# Patient Record
Sex: Male | Born: 1975 | Race: White | Hispanic: No | Marital: Married | Smoking: Never smoker
Health system: Southern US, Community
[De-identification: ages and names within clinical notes are randomized; demographics above are authoritative.]

## PROBLEM LIST (undated history)

## (undated) DIAGNOSIS — Z9989 Dependence on other enabling machines and devices: Secondary | ICD-10-CM

## (undated) DIAGNOSIS — E1129 Type 2 diabetes mellitus with other diabetic kidney complication: Secondary | ICD-10-CM

## (undated) DIAGNOSIS — E119 Type 2 diabetes mellitus without complications: Secondary | ICD-10-CM

## (undated) DIAGNOSIS — G4733 Obstructive sleep apnea (adult) (pediatric): Secondary | ICD-10-CM

## (undated) HISTORY — PX: VASECTOMY: SHX75

## (undated) HISTORY — DX: Dependence on other enabling machines and devices: Z99.89

## (undated) HISTORY — DX: Obstructive sleep apnea (adult) (pediatric): G47.33

## (undated) HISTORY — PX: REFRACTIVE SURGERY: SHX103

## (undated) HISTORY — DX: Type 2 diabetes mellitus with other diabetic kidney complication: E11.29

## (undated) HISTORY — PX: KNEE ARTHROSCOPY: SUR90

## (undated) HISTORY — DX: Type 2 diabetes mellitus without complications: E11.9

---

## 2017-10-14 ENCOUNTER — Encounter: Payer: Self-pay | Admitting: Physician Assistant

## 2017-10-14 ENCOUNTER — Ambulatory Visit (INDEPENDENT_AMBULATORY_CARE_PROVIDER_SITE_OTHER): Payer: Managed Care, Other (non HMO) | Admitting: Physician Assistant

## 2017-10-14 VITALS — BP 137/90 | HR 74 | Ht 70.0 in | Wt 247.0 lb

## 2017-10-14 DIAGNOSIS — Z9989 Dependence on other enabling machines and devices: Secondary | ICD-10-CM

## 2017-10-14 DIAGNOSIS — Z130 Encounter for screening for diseases of the blood and blood-forming organs and certain disorders involving the immune mechanism: Secondary | ICD-10-CM

## 2017-10-14 DIAGNOSIS — R350 Frequency of micturition: Secondary | ICD-10-CM

## 2017-10-14 DIAGNOSIS — Z131 Encounter for screening for diabetes mellitus: Secondary | ICD-10-CM

## 2017-10-14 DIAGNOSIS — G4733 Obstructive sleep apnea (adult) (pediatric): Secondary | ICD-10-CM | POA: Insufficient documentation

## 2017-10-14 DIAGNOSIS — Z7689 Persons encountering health services in other specified circumstances: Secondary | ICD-10-CM | POA: Diagnosis not present

## 2017-10-14 DIAGNOSIS — R740 Nonspecific elevation of levels of transaminase and lactic acid dehydrogenase [LDH]: Secondary | ICD-10-CM | POA: Diagnosis not present

## 2017-10-14 DIAGNOSIS — R3129 Other microscopic hematuria: Secondary | ICD-10-CM | POA: Diagnosis not present

## 2017-10-14 DIAGNOSIS — R81 Glycosuria: Secondary | ICD-10-CM

## 2017-10-14 DIAGNOSIS — E782 Mixed hyperlipidemia: Secondary | ICD-10-CM | POA: Diagnosis not present

## 2017-10-14 DIAGNOSIS — Z13220 Encounter for screening for lipoid disorders: Secondary | ICD-10-CM | POA: Diagnosis not present

## 2017-10-14 DIAGNOSIS — E1165 Type 2 diabetes mellitus with hyperglycemia: Secondary | ICD-10-CM

## 2017-10-14 DIAGNOSIS — R748 Abnormal levels of other serum enzymes: Secondary | ICD-10-CM

## 2017-10-14 DIAGNOSIS — R7401 Elevation of levels of liver transaminase levels: Secondary | ICD-10-CM

## 2017-10-14 LAB — GLUCOSE, POCT (MANUAL RESULT ENTRY): POC GLUCOSE: 383 mg/dL — AB (ref 70–99)

## 2017-10-14 LAB — POCT URINALYSIS DIPSTICK
Bilirubin, UA: NEGATIVE
GLUCOSE UA: POSITIVE — AB
Ketones, UA: NEGATIVE
Leukocytes, UA: NEGATIVE
Nitrite, UA: NEGATIVE
PROTEIN UA: NEGATIVE
Spec Grav, UA: 1.02 (ref 1.010–1.025)
Urobilinogen, UA: 0.2 E.U./dL
pH, UA: 6 (ref 5.0–8.0)

## 2017-10-14 MED ORDER — METFORMIN HCL 500 MG PO TABS: ORAL_TABLET | ORAL | 0 refills | Status: DC

## 2017-10-14 MED ORDER — METFORMIN HCL 500 MG PO TABS: ORAL_TABLET | ORAL | 2 refills | Status: DC

## 2017-10-14 NOTE — Patient Instructions (Signed)

## 2017-10-14 NOTE — Progress Notes (Signed)
HPI:                                                                Douglas Wilkinson is a 42 y.o. male who presents to [facility]: Primary Care [facility] Medicine today to establish care  Current concerns: urinary frequency  Reports 5 weeks of urinary frequency and excessive thirst. Reports he is urinating almost every 2 hours and having nocturia 4 times per night. Reports his mouth and lips are always dry. Symptoms are to the point where he is unable to wear his CPAP at night. Denies urgency, dysuria, hesitancy. Drinks 1-2 cans of soda or tea per day  IPSS Questionnaire (AUA-7): Over the past month.   1)  How often have you had a sensation of not emptying your bladder completely after you finish urinating?  0 - Not at all  2)  How often have you had to urinate again less than two hours after you finished urinating? 5 - Almost always  3)  How often have you found you stopped and started again several times when you urinated?  1 - Less than 1 time in 5  4) How difficult have you found it to postpone urination?  3 - About half the time  5) How often have you had a weak urinary stream?  1 - Less than 1 time in 5  6) How often have you had to push or strain to begin urination?  0 - Not at all  7) How many times did you most typically get up to urinate from the time you went to bed until the time you got up in the morning?  4 - 4 times  Total score:  0-7 mildly symptomatic   8-19 moderately symptomatic   20-35 severely symptomatic     No flowsheet data found.  No flowsheet data found.    Past Medical History:  Diagnosis Date  . OSA on CPAP    Past Surgical History:  Procedure Laterality Date  . KNEE ARTHROSCOPY Right   . VASECTOMY     Social History   Tobacco Use  . Smoking status: Never Smoker  . Smokeless tobacco: Never Used  Substance Use Topics  . Alcohol use: Not Currently   family history includes Heart attack in his father; Skin cancer in his maternal  grandfather.    ROS: negative except as noted in the HPI  Medications: Current Outpatient Medications  Medication Sig Dispense Refill  . atorvastatin (LIPITOR) 20 MG tablet Take 1 tablet (20 mg total) by mouth daily. 30 tablet 3  . metFORMIN (GLUCOPHAGE) 500 MG tablet Take 1 tablet (500 mg total) by mouth 2 (two) times daily with a meal for 7 days, THEN 2 tablets (1,000 mg total) 2 (two) times daily with a meal for 23 days. 120 tablet 0   No current facility-administered medications for this visit.    No Known Allergies     Objective:  BP 137/90   Pulse 74   Ht 5\' 10"  (1.778 m)   Wt 247 lb (112 kg)   BMI 35.44 kg/m  Gen:  alert, not ill-appearing, no distress, appropriate for age, obese male HEENT: head normocephalic without obvious abnormality, conjunctiva and cornea clear, trachea midline Pulm: Normal work  of breathing, normal phonation, clear to auscultation bilaterally, no wheezes, rales or rhonchi CV: Normal rate, regular rhythm, s1 and s2 distinct, no murmurs, clicks or rubs  Neuro: alert and oriented x 3, no tremor MSK: extremities atraumatic, normal gait and station Skin: intact, no rashes on exposed skin, no jaundice, no cyanosis Psych: well-groomed, cooperative, good eye contact, euthymic mood, affect mood-congruent, speech is articulate, and thought processes clear and goal-directed    No results found for this or any previous visit (from the past 72 hour(s)). No results found.    Assessment and Plan: 42 y.o. male with   Encounter to establish care  Urinary frequency - Plan: Comprehensive metabolic panel, Hemoglobin A1c, CANCELED: Osmolality, CANCELED: Osmolality, urine, CANCELED: Sodium, urine, random  Screening for lipid disorders - Plan: Lipid Panel w/reflex Direct LDL  Screening for diabetes mellitus - Plan: Hemoglobin A1c  Screening for blood disease - Plan: CBC, Comprehensive metabolic panel  Glucosuria - Plan: Hemoglobin A1c  Microscopic  hematuria - Plan: POCT Urinalysis Dipstick, Urine Culture, POCT Glucose (CBG)  Type 2 diabetes mellitus with hyperglycemia, without long-term current use of insulin (HCC) - Plan: metFORMIN (GLUCOPHAGE) 500 MG tablet, POCT Glucose (CBG), Ambulatory referral to diabetic education, DISCONTINUED: metFORMIN (GLUCOPHAGE) 500 MG tablet  Mixed hypercholesterolemia and hypertriglyceridemia - Plan: atorvastatin (LIPITOR) 20 MG tablet  Transaminitis - Plan: Hepatitis panel, acute, US ELASTOGRAPHY LIVER  Elevated alkaline phosphatase level - Plan: US ELASTOGRAPHY LIVER  - Personally reviewed PMH, PSH, PFH, medications, allergies, HM - Age-appropriate cancer screening: n/a - Influenza n/a - Tdap declined  Type 2 Diabetes Lab Results  Component Value Date   HGBA1C 11.0 (H) 10/14/2017  - new diagnosis, presented with polyuria and glucosuria. A1C 11 - discussed pathophysiology and management of type 2 diabetes - counseled on ADA diet. Referred to diabetes educator - Metformin 1000 mg bid. Will likely need dual therapy for adequate glycemic control - plan to start stain based on fasting lipid panel - plan to start ACE if BP not at goal <130/80  Patient education and anticipatory guidance given Patient agrees with treatment plan Follow-up in 1 month or sooner as needed if symptoms worsen or fail to improve  Levonne Hubertharley E. Tephanie Escorcia PA-C

## 2017-10-15 LAB — CBC
HCT: 45.7 % (ref 38.5–50.0)
HEMOGLOBIN: 16.4 g/dL (ref 13.2–17.1)
MCH: 31.3 pg (ref 27.0–33.0)
MCHC: 35.9 g/dL (ref 32.0–36.0)
MCV: 87.2 fL (ref 80.0–100.0)
MPV: 10.7 fL (ref 7.5–12.5)
Platelets: 210 10*3/uL (ref 140–400)
RBC: 5.24 10*6/uL (ref 4.20–5.80)
RDW: 12.3 % (ref 11.0–15.0)
WBC: 6.1 10*3/uL (ref 3.8–10.8)

## 2017-10-15 LAB — COMPREHENSIVE METABOLIC PANEL
AG Ratio: 1.7 (calc) (ref 1.0–2.5)
ALBUMIN MSPROF: 4.7 g/dL (ref 3.6–5.1)
ALT: 114 U/L — AB (ref 9–46)
AST: 56 U/L — ABNORMAL HIGH (ref 10–40)
Alkaline phosphatase (APISO): 117 U/L — ABNORMAL HIGH (ref 40–115)
BILIRUBIN TOTAL: 0.8 mg/dL (ref 0.2–1.2)
BUN: 15 mg/dL (ref 7–25)
CALCIUM: 9.6 mg/dL (ref 8.6–10.3)
CO2: 25 mmol/L (ref 20–32)
Chloride: 99 mmol/L (ref 98–110)
Creat: 1.13 mg/dL (ref 0.60–1.35)
Globulin: 2.8 g/dL (calc) (ref 1.9–3.7)
Glucose, Bld: 383 mg/dL — ABNORMAL HIGH (ref 65–99)
Potassium: 4.2 mmol/L (ref 3.5–5.3)
Sodium: 135 mmol/L (ref 135–146)
TOTAL PROTEIN: 7.5 g/dL (ref 6.1–8.1)

## 2017-10-15 LAB — LIPID PANEL W/REFLEX DIRECT LDL
Cholesterol: 270 mg/dL — ABNORMAL HIGH (ref <200)
HDL: 24 mg/dL — AB (ref >40)
Non-HDL Cholesterol (Calc): 246 mg/dL (calc) — ABNORMAL HIGH (ref <130)
Total CHOL/HDL Ratio: 11.3 (calc) — ABNORMAL HIGH (ref <5.0)
Triglycerides: 1650 mg/dL — ABNORMAL HIGH (ref <150)

## 2017-10-15 LAB — HEMOGLOBIN A1C
Hgb A1c MFr Bld: 11 % of total Hgb — ABNORMAL HIGH (ref <5.7)
Mean Plasma Glucose: 269 (calc)
eAG (mmol/L): 14.9 (calc)

## 2017-10-15 LAB — DIRECT LDL

## 2017-10-18 MED ORDER — ATORVASTATIN CALCIUM 20 MG PO TABS: 20.0000 mg | ORAL_TABLET | Freq: Every day | ORAL | 3 refills | Status: DC

## 2017-10-18 NOTE — Progress Notes (Signed)
Good morning Douglas Wilkinson,  Your total cholesterol and triglycerdies are very high. I am going to start you on an additional medication to get these down. When you return for follow-up, please come in fasting so we can recheck your lipids (nothing to eat or drink except water for 8 hours).  The medication is called Atorvastatin. You can take it in the evening or in the morning with your other medications.  Your liver enzymes are also high, so I am going to order some additional liver tests including an ultrasound.

## 2017-10-21 ENCOUNTER — Encounter: Payer: Self-pay | Admitting: Physician Assistant

## 2017-10-23 ENCOUNTER — Encounter: Payer: Self-pay | Admitting: Physician Assistant

## 2017-10-23 DIAGNOSIS — R81 Glycosuria: Secondary | ICD-10-CM | POA: Insufficient documentation

## 2017-10-23 DIAGNOSIS — R748 Abnormal levels of other serum enzymes: Secondary | ICD-10-CM | POA: Insufficient documentation

## 2017-10-23 DIAGNOSIS — R740 Nonspecific elevation of levels of transaminase and lactic acid dehydrogenase [LDH]: Secondary | ICD-10-CM

## 2017-10-23 DIAGNOSIS — R7401 Elevation of levels of liver transaminase levels: Secondary | ICD-10-CM | POA: Insufficient documentation

## 2017-10-23 DIAGNOSIS — E782 Mixed hyperlipidemia: Secondary | ICD-10-CM | POA: Insufficient documentation

## 2017-10-23 DIAGNOSIS — E1165 Type 2 diabetes mellitus with hyperglycemia: Secondary | ICD-10-CM | POA: Insufficient documentation

## 2017-10-23 DIAGNOSIS — R3129 Other microscopic hematuria: Secondary | ICD-10-CM | POA: Insufficient documentation

## 2017-10-26 ENCOUNTER — Ambulatory Visit (HOSPITAL_COMMUNITY): Payer: Managed Care, Other (non HMO)

## 2017-10-28 ENCOUNTER — Encounter: Payer: Self-pay | Admitting: Physician Assistant

## 2017-10-28 ENCOUNTER — Ambulatory Visit (HOSPITAL_COMMUNITY)
Admission: RE | Admit: 2017-10-28 | Discharge: 2017-10-28 | Disposition: A | Discharge status: Home / Self Care | Payer: Managed Care, Other (non HMO) | Source: Ambulatory Visit | Attending: Physician Assistant | Admitting: Physician Assistant

## 2017-10-28 DIAGNOSIS — R748 Abnormal levels of other serum enzymes: Secondary | ICD-10-CM

## 2017-10-28 DIAGNOSIS — R740 Nonspecific elevation of levels of transaminase and lactic acid dehydrogenase [LDH]: Secondary | ICD-10-CM | POA: Insufficient documentation

## 2017-10-28 DIAGNOSIS — R7401 Elevation of levels of liver transaminase levels: Secondary | ICD-10-CM

## 2017-10-28 DIAGNOSIS — K760 Fatty (change of) liver, not elsewhere classified: Secondary | ICD-10-CM | POA: Insufficient documentation

## 2017-11-15 ENCOUNTER — Ambulatory Visit (INDEPENDENT_AMBULATORY_CARE_PROVIDER_SITE_OTHER): Payer: Managed Care, Other (non HMO) | Admitting: Physician Assistant

## 2017-11-15 ENCOUNTER — Encounter: Payer: Self-pay | Admitting: Physician Assistant

## 2017-11-15 VITALS — BP 108/71 | HR 79 | Temp 97.8°F | Wt 239.0 lb

## 2017-11-15 DIAGNOSIS — E1165 Type 2 diabetes mellitus with hyperglycemia: Secondary | ICD-10-CM | POA: Diagnosis not present

## 2017-11-15 DIAGNOSIS — E782 Mixed hyperlipidemia: Secondary | ICD-10-CM

## 2017-11-15 DIAGNOSIS — Z2821 Immunization not carried out because of patient refusal: Secondary | ICD-10-CM | POA: Diagnosis not present

## 2017-11-15 DIAGNOSIS — R809 Proteinuria, unspecified: Secondary | ICD-10-CM

## 2017-11-15 DIAGNOSIS — E1129 Type 2 diabetes mellitus with other diabetic kidney complication: Secondary | ICD-10-CM | POA: Insufficient documentation

## 2017-11-15 LAB — POCT UA - MICROALBUMIN

## 2017-11-15 MED ORDER — METFORMIN HCL ER 500 MG PO TB24: 500.0000 mg | ORAL_TABLET | Freq: Every day | ORAL | 5 refills | Status: DC

## 2017-11-15 MED ORDER — LISINOPRIL 5 MG PO TABS: 5.0000 mg | ORAL_TABLET | Freq: Every day | ORAL | 5 refills | Status: DC

## 2017-11-15 NOTE — Progress Notes (Signed)
HPI:                                                                Douglas Wilkinson is a 42 y.o. male who [facility] today for diabetes follow-up  DMII: new diagnosis 1 month ago. Presented with polyuria. Currently taking Metformin 1000 mg bid and is having frequent, watery bowel movements. Has been feeling very fatigued with urge to nap for the last 2 weeks. Compliant with medications.  Has an appointment with diabetes educator at the end of the month Reports has reduced carbohydrates and sugar, has lost approximately 10 pounds Denies polydipsia, polyuria, polyphagia. Denies blurred vision or vision change. Denies extremity pain, altered sensation and paresthesias.  Denies ulcers/wounds on feet. Hx of DKA/HHS: no Diabetes associated symptoms: none Blood glucose readings: FBG's 99-119 Hypoglycemia frequency: none Severe hypoglycemia (requiring 3rd party assistance): none      [facility] 11/15/2017  Decreased Interest 0  Down, Depressed, Hopeless 0  PHQ - 2 Score 0  Altered sleeping 0  Tired, decreased energy 2  Change in appetite 0  Feeling bad or failure about yourself  0  Trouble concentrating 0  Moving slowly or fidgety/restless 0  Suicidal thoughts 0  PHQ-9 Score 2  Difficult doing work/chores Not difficult at all    GAD 7 : Generalized Anxiety Score 11/15/2017  Nervous, Anxious, on Edge 0  Control/stop worrying 0  Worry too much - different things 0  Trouble relaxing 0  Restless 0  Easily annoyed or irritable 1  Afraid - awful might happen 0  Total GAD 7 Score 1  Anxiety Difficulty Not difficult at all      Past Medical History:  Diagnosis Date  . OSA on CPAP    Past Surgical History:  Procedure Laterality Date  . KNEE ARTHROSCOPY Right   . REFRACTIVE SURGERY    . VASECTOMY     Social History   Tobacco Use  . Smoking status: Never Smoker  . Smokeless tobacco: Never Used   Substance Use Topics  . Alcohol use: Not Currently   family history includes Heart attack in his father; Skin cancer in his maternal grandfather.    ROS: negative except as noted in the HPI  Medications: Current Outpatient Medications  Medication Sig Dispense Refill  . atorvastatin (LIPITOR) 20 MG tablet Take 1 tablet (20 mg total) by mouth daily. 30 tablet 3  . lisinopril (PRINIVIL,ZESTRIL) 5 MG tablet Take 1 tablet (5 mg total) by mouth daily. 30 tablet 5  . metFORMIN (GLUCOPHAGE XR) 500 MG 24 hr tablet Take 2 tablets (1,000 mg total) by mouth daily with supper. 180 tablet 0   No current facility-administered medications for this visit.    No Known Allergies     Objective:  BP 108/71   Pulse 79   Temp 97.8 F (36.6 C) (Oral)   Wt 239 lb (108.4 kg)   BMI 34.29 kg/m  Gen:  alert, not ill-appearing, no distress, appropriate for age, obese male HEENT: head normocephalic without obvious abnormality, conjunctiva and cornea clear, trachea midline Pulm: Normal work of breathing, normal phonation, clear to auscultation bilaterally, no wheezes, rales or rhonchi CV: Normal rate, regular rhythm, s1 and s2  distinct, no murmurs, clicks or rubs  Neuro: alert and oriented x 3, no tremor MSK: extremities atraumatic, normal gait and station Skin: intact, no rashes on exposed skin, no jaundice, no cyanosis Psych: well-groomed, cooperative, good eye contact, euthymic mood, affect mood-congruent, speech is articulate, and thought processes clear and goal-directed  Diabetic Foot Exam - Simple   Simple Foot Form Diabetic Foot exam was performed with the following findings:  Yes 11/15/2017  2:40 PM  Visual Inspection No deformities, no ulcerations, no other skin breakdown bilaterally:  Yes Sensation Testing Intact to touch and monofilament testing bilaterally:  Yes Pulse Check Posterior Tibialis and Dorsalis pulse intact bilaterally:  Yes Comments      No results found for this or  any previous visit (from the past 72 hour(s)). No results found.  Lab Results  Component Value Date   HGBA1C 11.0 (H) 10/14/2017     Assessment and Plan: 42 y.o. male with   .Douglas Wilkinson was seen today for diabetes.  Diagnoses and all orders for this visit:  Type 2 diabetes mellitus with hyperglycemia, without long-term current use of insulin (HCC) -     Discontinue: metFORMIN (GLUCOPHAGE XR) 500 MG 24 hr tablet; Take 1 tablet (500 mg total) by mouth daily with supper. -     POCT UA - Microalbumin -     metFORMIN (GLUCOPHAGE XR) 500 MG 24 hr tablet; Take 2 tablets (1,000 mg total) by mouth daily with supper.  Mixed hypercholesterolemia and hypertriglyceridemia -     Lipid Panel w/reflex Direct LDL  Microalbuminuria due to type 2 diabetes mellitus (HCC) -     lisinopril (PRINIVIL,ZESTRIL) 5 MG tablet; Take 1 tablet (5 mg total) by mouth daily.  Refused pneumococcal vaccination   - switching to Metformin XR due to GI upset. Will likely need second agent for glycemic control due to A1C of 11.0. Will wait until he is seen by diabetes educator. He has already lost 8 pounds in 4 weeks. - microalbuminuria on screening today, adding low-dose ACE - BP at goal <130/80 - LDL goal <70, cont Atorvastatin 20 mg - eye exam UTD per patient, requesting records from [facility] - foot exam performed today and normal - discussed diabetes preventive care - declined influenza and pneumococcal vaccines   Patient education and anticipatory guidance given Patient agrees with treatment plan Follow-up in 3 months for diabetes mgmt or sooner as needed if symptoms worsen or fail to improve  Levonne Hubertharley E. Javarion Douty PA-C

## 2017-11-15 NOTE — Patient Instructions (Addendum)
Diabetes Preventive Care: - annual foot exam  - annual dilated eye exam with an eye doctor - self foot exams at least weekly - pneumonia vaccine once (booster in 5 years and at age 42) - annual influenza vaccine - twice yearly dental cleanings and yearly exam - goal blood pressure <140/90, ideally <130/80 - LDL cholesterol <70 - A1C <2.1<7.0 - body mass index (BMI) <25.0 - follow-up every 3 months if your A1C is not at goal - follow-up every 6 months if diabetes is well controlled    Diabetic Nephropathy Diabetic nephropathy is kidney disease that is caused by diabetes (diabetes mellitus). Kidneys are organs that filter and clean blood and get rid of body waste products and extra fluid. Diabetes can cause gradual kidney damage over many years. Diabetic nephropathy that continues to get worse (progress) can lead to kidney failure. What are the causes? This condition is caused by kidney damage from diabetes that is not well controlled with treatment. Having high blood sugar (glucose) for a long time can damage blood vessels in the kidneys and cause them to thicken and scar. This prevents the kidneys from functioning normally. What increases the risk? This condition is more likely to develop in people with diabetes who:  Have had diabetes for many years.  Have high blood pressure.  Have high blood glucose levels over a long period of time.  Have a family history of kidney disease.  Have a history of tobacco use.  Have certain genes that are passed from parent to child (inherited).  Are of African-American, Hispanic, Native American, Asian, or Pacific Islander descent.  What are the signs or symptoms? This condition may not cause symptoms at first. If you do have symptoms, they may include:  Swelling of your hands, feet, or ankles.  Weakness.  Poor appetite.  Nausea.  Confusion.  Fatigue.  Trouble sleeping.  Dry, itchy skin.  If nephropathy leads to kidney failure,  symptoms may include:  Vomiting.  Shortness of breath.  Jerky movements you cannot control (seizure).  Coma.  How is this diagnosed? It is important to diagnose this condition before symptoms develop. You may be screened for diabetic nephropathy at a routine health care visit. Screening tests may include:  Yearly (annual) urine tests.  Urine collection over a 24-hour period to measure kidney function.  Blood tests that measure blood glucose levels and kidney function.  If your health care provider suspects diabetic nephropathy, he or she may:  Review your medical history and symptoms.  Do a physical exam.  Do an ultrasound of your kidneys.  Perform a procedure to take a sample of kidney tissue for testing (biopsy).  How is this treated? The goal of treatment is to prevent or slow down damage to your kidneys by managing your diabetes. To do this, it is important to control:  Your blood pressure. ? Generally, the goal is to keep your blood pressure below 130/80. Your target blood pressure depends on many factors. ? To help control blood pressure, you may be prescribed medicines to lower blood pressure (ACE inhibitors) or to help your body get rid of excess fluid (diuretics).  Your A1c (hemoglobin A1c) level. Generally, the goal of treatment is to maintain an A1c level of less than 7%.  Your blood glucose level.  Your blood lipids. If you have high cholesterol, you may need to take lipid-lowering drugs, such as statins.  Other treatments may include:  Medicines, including insulin injections.  Lifestyle changes, such as: ?  Losing weight. ? Quitting smoking (smoking cessation).  Changes to your diet, which may include: ? Limiting your salt (sodium), protein, and fluid intake. ? Taking vitamin D supplements.  If your disease progresses to end-stage kidney failure, treatment may include:  Dialysis. This is a procedure to filter your blood with a machine.  Kidney  transplant.  Follow these instructions at home: Lifestyle  Maintain a healthy weight. Work with your health care provider to lose weight, if needed.  Do not use any products that contain nicotine or tobacco, such as cigarettes and e-cigarettes. If you need help quitting, ask your health care provider.  Be physically active every day. Ask your health care provider what type of exercise is best for you.  Eat healthy foods, and eat healthy snacks between meals. Follow instructions from your health care provider about eating and drinking restrictions. ? Limit your sodium, protein, or fluid intake as directed.  Work with your health care provider to manage your blood pressure. General instructions  Follow your diabetes management plan as directed. ? Check your blood glucose levels as directed by your health care provider. ? Keep your blood glucose in your target range as directed by your health care provider. ? Have your A1c level checked at least two times a year, or as often as told by your health care provider.  Take over-the-counter and prescription medicines only as told by your health care provider. This includes insulin and supplements.  Keep all follow-up visits and routine visits as told by your health care provider. This is important. Make sure to get screening tests as directed. Contact a health care provider if:  You have trouble keeping your blood glucose in your goal range.  Your blood glucose level is higher than 240 mg/dL (16.113.3 mmol/L) for 2 days in a row.  You have swelling in your hands, ankles, or feet.  You feel weak, tired, or dizzy.  You have involuntary muscle tightening (spasms).  You have nausea or vomiting.  You feel tired all the time. Get help right away if:  You are very sleepy.  You have: ? A seizure. ? Severe, painful muscle spasms. ? Shortness of breath. ? Chest pain.  You faint. Summary  Keep your blood glucose in your target range as  directed by your health care provider.  Work with your health care provider to manage your blood pressure.  Keep all follow-up visits and routine visits as told by your health care provider. This is important. Make sure to get screening tests as directed. This information is not intended to replace advice given to you by your health care provider. Make sure you discuss any questions you have with your health care provider. Document Released: 04/11/2007 Document Revised: 02/18/2016 Document Reviewed: 02/18/2016 Elsevier Interactive Patient Education  [employer]2018 [employer]

## 2017-11-17 LAB — LIPID PANEL W/REFLEX DIRECT LDL
Cholesterol: 111 mg/dL (ref <200)
HDL: 27 mg/dL — ABNORMAL LOW (ref >40)
LDL Cholesterol (Calc): 63 mg/dL (calc)
Non-HDL Cholesterol (Calc): 84 mg/dL (calc) (ref <130)
Total CHOL/HDL Ratio: 4.1 (calc) (ref <5.0)
Triglycerides: 124 mg/dL (ref <150)

## 2017-11-26 ENCOUNTER — Encounter: Payer: Self-pay | Admitting: Physician Assistant

## 2017-11-26 DIAGNOSIS — Z2821 Immunization not carried out because of patient refusal: Secondary | ICD-10-CM | POA: Insufficient documentation

## 2017-11-26 MED ORDER — METFORMIN HCL ER 500 MG PO TB24
1000.0000 mg | ORAL_TABLET | Freq: Every day | ORAL | 0 refills | Status: DC
Start: 2017-11-26 — End: 2018-02-24

## 2017-12-01 ENCOUNTER — Encounter: Payer: Self-pay | Admitting: Registered"

## 2017-12-01 ENCOUNTER — Encounter: Payer: Managed Care, Other (non HMO) | Attending: Physician Assistant | Admitting: Registered"

## 2017-12-01 DIAGNOSIS — E1165 Type 2 diabetes mellitus with hyperglycemia: Secondary | ICD-10-CM | POA: Diagnosis present

## 2017-12-01 DIAGNOSIS — Z713 Dietary counseling and surveillance: Secondary | ICD-10-CM | POA: Diagnosis not present

## 2017-12-01 NOTE — Patient Instructions (Addendum)
Protein bar might be an option for breakfast. Can also try some smoothies that have protein included. Aim to eat balanced meals and snacks Continue checking your blood sugar and play detective to see how adding more carbs affects your blood sugar. Continue with your plan to find a exercise that you enjoy and ultimately aim for 3-5 time per week for 30-45 min

## 2017-12-01 NOTE — Progress Notes (Signed)
Diabetes Self-Management Education  Visit Type: First/Initial  Appt. Start Time: 1450 Appt. End Time: 1610  12/06/2017  [name], identified by name and date of birth, is a 42 y.o. male with a diagnosis of Diabetes: Type 2.   ASSESSMENT Pt states he has made some major changes to his diet avoiding sugar and carbs since getting the T2DM diagnosis. Pt states he does not go to the doctor very often, polyuria is what prompted this last MD visit. Pt states is was not aware he was supposed to increase his metformin dose, but because his reported SMBG is in the normal range and RD advised he talk to MD before changing medication dose.  Physcial activity: Pt states he feels tired, has a hard time being motivated at gyms because he finds them boring, but is considering kick boxing. RD believes he has cut back carbs too far and recommended including more to help increase energy. Pt states the reason he drinks caffeine for headaches.  Sleep: doesn't snore and isn't restless. Pt reports ~9:30-10 gets sleepy but waits to go to sleep so he doesn't wake up 5 am and he gets a headache if too much sleep. Pt states he is fine as long as he keeps going, but when he stops will get tired. Stress: 2/10; maybe higher at work.  Diabetes Self-Management Education - 12/01/17 1455      Visit Information   Visit Type  First/Initial      Initial Visit   Diabetes Type  Type 2    Are you currently following a meal plan?  Yes    What type of meal plan do you follow?  low sugar, low carbs    Are you taking your medications as prescribed?  Yes    Date Diagnosed  10/14/2017      Health Coping   How would you rate your overall health?  Good      Psychosocial Assessment   Patient Belief/Attitude about Diabetes  Motivated to manage diabetes    How often do you need to have someone help you when you read instructions, pamphlets, or other written materials from your doctor or pharmacy?  1 - Never    What is the  last grade level you completed in school?  HS grad      Complications   Last HgB A1C per patient/outside source  11 %    How often do you check your blood sugar?  1-2 times/day    Fasting Blood glucose range (mg/dL)  16-109    Postprandial Blood glucose range (mg/dL)  60-454    Number of hypoglycemic episodes per month  0    Number of hyperglycemic episodes per week  0    Have you had a dilated eye exam in the past 12 months?  Yes    Have you had a dental exam in the past 12 months?  Yes    Are you checking your feet?  No      Dietary Intake   Breakfast  none (coffee)    Snack (morning)  none OR P3    Lunch  usually adds veggies to lunch. 6 oz sirolin OR chicken salad     Snack (afternoon)  none or meat/cheese OR cashews, pistachios    Dinner  meat, veggies, 2 or 3 small potatoes    Snack (evening)  none or cheese & meat    Beverage(s)  water, coffee, diet soda (mt dew), unsweet tea  Exercise   Exercise Type  Light (walking / raking leaves)    How many days per week to you exercise?  1    How many minutes per day do you exercise?  50    Total minutes per week of exercise  50      Patient Education   Previous Diabetes Education  No    Disease state   Definition of diabetes, type 1 and 2, and the diagnosis of diabetes    Nutrition management   Role of diet in the treatment of diabetes and the relationship between the three main macronutrients and blood glucose level    Physical activity and exercise   Role of exercise on diabetes management, blood pressure control and cardiac health.    Monitoring  Identified appropriate SMBG and/or A1C goals.      Individualized Goals (developed by patient)   Nutrition  General guidelines for healthy choices and portions discussed    Physical Activity  Exercise 3-5 times per week    Monitoring   test my blood glucose as discussed      Outcomes   Expected Outcomes  Demonstrated interest in learning. Expect positive outcomes    Future DMSE   PRN    Program Status  Completed     Individualized Plan for Diabetes Self-Management Training:   Learning Objective:  Patient will have a greater understanding of diabetes self-management. Patient education plan is to attend individual and/or group sessions per assessed needs and concerns.   Patient Instructions  Protein bar might be an option for breakfast. Can also try some smoothies that have protein included. Aim to eat balanced meals and snacks Continue checking your blood sugar and play detective to see how adding more carbs affects your blood sugar. Continue with your plan to find a exercise that you enjoy and ultimately aim for 3-5 time per week for 30-45 min  Expected Outcomes:  Demonstrated interest in learning. Expect positive outcomes  Education material provided: ADA Diabetes: Your Take Control Guide, A1C conversion sheet and Carbohydrate counting sheet  If problems or questions, patient to contact team via:  Phone or MyChart  Future DSME appointment: PRN

## 2018-01-14 ENCOUNTER — Emergency Department (INDEPENDENT_AMBULATORY_CARE_PROVIDER_SITE_OTHER)
Admission: EM | Admit: 2018-01-14 | Discharge: 2018-01-14 | Disposition: A | Discharge status: Home / Self Care | Payer: Managed Care, Other (non HMO) | Source: Home / Self Care | Attending: Emergency Medicine | Admitting: Emergency Medicine

## 2018-01-14 ENCOUNTER — Other Ambulatory Visit: Payer: Self-pay

## 2018-01-14 DIAGNOSIS — J069 Acute upper respiratory infection, unspecified: Secondary | ICD-10-CM

## 2018-01-14 MED ORDER — AZITHROMYCIN 250 MG PO TABS: ORAL_TABLET | ORAL | 0 refills | Status: DC

## 2018-01-14 MED ORDER — BENZONATATE 100 MG PO CAPS: 100.0000 mg | ORAL_CAPSULE | Freq: Three times a day (TID) | ORAL | 0 refills | Status: DC | PRN

## 2018-01-14 NOTE — ED Provider Notes (Signed)
[facility]    CSN: [id number] Arrival date & time: 01/14/18  0931     History   Chief Complaint Chief Complaint  Patient presents with  . Cough    HPI Douglas Wilkinson is a 42 y.o. male.   HPI Patient states he became ill 3-4 days ago. He has had low-grade fever off and on as well as a deep cough. He usually has productive phlegm but does not know tlor. He has been taking TheraFlu without improvement. His concern is regarding his daughter having an emergency C-section with the newborn having congenital problems. He is going to need to get on an airplane and fly to [place].he has not had his flu shot. He does not Vape. He has not been to the fair in [place] Past Medical History:  Diagnosis Date  . OSA on CPAP     Patient Active Problem List   Diagnosis Date Noted  . Refused pneumococcal vaccination 11/26/2017  . Microalbuminuria due to type 2 diabetes mellitus (HCC) 11/15/2017  . Hepatic steatosis 10/28/2017  . Glucosuria 10/23/2017  . Microscopic hematuria 10/23/2017  . Type 2 diabetes mellitus with hyperglycemia, without long-term current use of insulin (HCC) 10/23/2017  . Mixed hypercholesterolemia and hypertriglyceridemia 10/23/2017  . Transaminitis 10/23/2017  . Elevated alkaline phosphatase level 10/23/2017  . OSA on CPAP     Past Surgical History:  Procedure Laterality Date  . KNEE ARTHROSCOPY Right   . REFRACTIVE SURGERY    . VASECTOMY         Home Medications    Prior to Admission medications   Medication Sig Start Date End Date Taking? Authorizing Provider  atorvastatin (LIPITOR) 20 MG tablet Take 1 tablet (20 mg total) by mouth daily. 10/18/17   Carlis Stable, PA-C  azithromycin (ZITHROMAX) 250 MG tablet Take 2 today then one a day for 4 days. 01/14/18   Collene Gobble, MD  benzonatate (TESSALON) 100 MG capsule Take 1-2 capsules (100-200 mg total) by mouth 3 (three) times daily as needed for cough. 01/14/18   Collene Gobble, MD  lisinopril (PRINIVIL,ZESTRIL) 5 MG tablet Take 1 tablet (5 mg total) by mouth daily. 11/15/17   Carlis Stable, PA-C  metFORMIN (GLUCOPHAGE XR) 500 MG 24 hr tablet Take 2 tablets (1,000 mg total) by mouth daily with supper. 11/26/17   Carlis Stable, PA-C    Family History Family History  Problem Relation Age of Onset  . Heart attack Father   . Skin cancer Maternal Grandfather     Social History Social History   Tobacco Use  . Smoking status: Never Smoker  . Smokeless tobacco: Never Used  Substance Use Topics  . Alcohol use: Not Currently  . Drug use: Not on file     Allergies   Patient has no known allergies.   Review of Systems Review of Systems  Constitutional: Positive for fever.  HENT: Positive for congestion.   Respiratory: Positive for cough. Negative for wheezing.   Cardiovascular: Negative for chest pain.  Gastrointestinal: Negative.      Physical Exam Triage Vital Signs ED Triage Vitals  Enc Vitals Group     BP 01/14/18 0946 (!) 143/88     Pulse Rate 01/14/18 0946 68     Resp --      Temp 01/14/18 0946 98.3 F (36.8 C)     Temp src --      SpO2 01/14/18 0946 99 %     Weight 01/14/18  0947 223 lb (101.2 kg)     Height 01/14/18 0947 5\' 10"  (1.778 m)     Head Circumference --      Peak Flow --      Pain Score 01/14/18 0947 0     Pain Loc --      Pain Edu? --      Excl. in GC? --    No data found.  Updated Vital Signs BP (!) 143/88 (BP Location: Right Arm)   Pulse 68   Temp 98.3 F (36.8 C)   Ht 5\' 10"  (1.778 m)   Wt 101.2 kg   SpO2 99%   BMI 32.00 kg/m   Visual Acuity Right Eye Distance:   Left Eye Distance:   Bilateral Distance:    Right Eye Near:   Left Eye Near:    Bilateral Near:     Physical Exam  Constitutional: He appears well-developed and well-nourished.  HENT:  Nose: Nose normal.  Mouth/Throat: Oropharynx is clear and moist.  Neck: Normal range of motion.  Cardiovascular: Normal  rate and regular rhythm.  Pulmonary/Chest: Effort normal and breath sounds normal.     UC Treatments / Results  Labs (all labs ordered are listed, but only abnormal results are displayed) Labs Reviewed - No data to display  EKG None  Radiology No results found.  Procedures Procedures (including critical care time)  Medications Ordered in UC Medications - No data to display  Initial Impression / Assessment and Plan / UC Course  I have reviewed the triage vital signs and the nursing notes.  Pertinent labs & imaging results that were available during my care of the patient were reviewed by me and considered in my medical decision making (see chart for details). This is most likely a viral upper respiratory infection. Treatment is complicated because of recent family issues and need to fly to [place] tomorrow. I will treat with Zithromax and Tessalonpearls for coverage.     Final Clinical Impressions(s) / UC Diagnoses   Final diagnoses:  Acute upper respiratory infection     Discharge Instructions     Take medication as instructed. Watch watch your diabetes closely. If you develop worsening symptoms or shortness of breath please present to the emergency room.    ED Prescriptions    Medication Sig Dispense Auth. Provider   azithromycin (ZITHROMAX) 250 MG tablet Take 2 today then one a day for 4 days. 6 tablet Collene Gobble, MD   benzonatate (TESSALON) 100 MG capsule Take 1-2 capsules (100-200 mg total) by mouth 3 (three) times daily as needed for cough. 40 capsule Collene Gobble, MD     Controlled Substance Prescriptions [place] Controlled Substance Registry consulted? Not Applicable   Collene Gobble, MD 01/14/18 1635

## 2018-01-14 NOTE — Discharge Instructions (Signed)
Take medication as instructed. Watch watch your diabetes closely. If you develop worsening symptoms or shortness of breath please present to the emergency room.

## 2018-01-14 NOTE — ED Triage Notes (Signed)
Pt c/o deep chest cough x 4 days. Fever on and off. Some congestion. Taking OTC meds for symptoms with no real relief.

## 2018-02-08 ENCOUNTER — Other Ambulatory Visit: Payer: Self-pay | Admitting: Physician Assistant

## 2018-02-08 DIAGNOSIS — E782 Mixed hyperlipidemia: Secondary | ICD-10-CM

## 2018-02-11 ENCOUNTER — Encounter: Payer: Self-pay | Admitting: Physician Assistant

## 2018-02-15 ENCOUNTER — Encounter: Payer: Self-pay | Admitting: Physician Assistant

## 2018-02-15 ENCOUNTER — Ambulatory Visit (INDEPENDENT_AMBULATORY_CARE_PROVIDER_SITE_OTHER): Payer: Managed Care, Other (non HMO) | Admitting: Physician Assistant

## 2018-02-15 VITALS — BP 130/84 | HR 66 | Wt 223.0 lb

## 2018-02-15 DIAGNOSIS — R809 Proteinuria, unspecified: Secondary | ICD-10-CM

## 2018-02-15 DIAGNOSIS — E1129 Type 2 diabetes mellitus with other diabetic kidney complication: Secondary | ICD-10-CM | POA: Diagnosis not present

## 2018-02-15 DIAGNOSIS — G4733 Obstructive sleep apnea (adult) (pediatric): Secondary | ICD-10-CM | POA: Diagnosis not present

## 2018-02-15 DIAGNOSIS — Z9989 Dependence on other enabling machines and devices: Secondary | ICD-10-CM | POA: Diagnosis not present

## 2018-02-15 LAB — POCT GLYCOSYLATED HEMOGLOBIN (HGB A1C): HEMOGLOBIN A1C: 5.2 % (ref 4.0–5.6)

## 2018-02-15 MED ORDER — NONFORMULARY OR COMPOUNDED ITEM: 0 refills | Status: AC

## 2018-02-15 NOTE — Patient Instructions (Signed)
Proteinuria Proteinuria is when there is too much protein in the urine. Proteins are important for buildingmuscles and bones. Proteins are also needed to fight infections, help the blood clot, and keep body fluids in balance. Proteinuria may be mild and temporary, or it may be an early sign of kidney disease. The kidneys make urine. Healthy kidneys also keep substances like proteins from leaving the blood and ending up in the urine. Proteinuria may be a sign that the kidneys are not working well. What are the causes? Healthy kidneys have filters (glomeruli) that keep proteins out of the urine. Proteinuria may mean that the glomeruli are damaged. The main causes of this type of damage are:  Diabetes.  High blood pressure.  Other causes of kidney damage can also cause proteinuria, such as:  Diseases of the immune system, such as lupus, rheumatoid arthritis, sarcoidosis, and Goodpasture syndrome.  Heart disease or heart failure.  Kidney infection.  Certain cancers, including kidney cancer, lymphoma, leukemia, and multiple myeloma.  Amyloidosis. This is a disease that causes abnormal proteins to build up in body tissues.  Reactions to certain medicines, such as NSAIDs.  Injury (trauma) or poisons (toxins).  High blood pressure that occurs during pregnancy (preeclampsia).  Temporary proteinuria may result from conditions that put stress on the kidneys. These conditions usually do not cause kidney damage. They include:  Fever.  Exposure to cold or heat.  Emotional or physical stress.  Extreme exercise.  Standing for long periods of time.  What increases the risk? This condition is more likely to develop in people who:  Have diabetes.  Have high blood pressure.  Have heart disease or heart failure.  Have an immune disease, cancer, or other disease that affects the kidneys.  Have a family history of kidney disease.  Are 65 or older.  Are overweight.  Are of  African-American, American [place], Hispanic/Latino, or [place] descent.  Are pregnant.  Have an infection.  What are the signs or symptoms? Mild proteinuria may not cause symptoms. As more proteins enter the urine, symptoms of kidney disease may develop, such as:  Foamy urine.  Swelling of the face, abdomen, hands, legs, or feet (edema).  Needing to urinate frequently.  Fatigue.  Difficulty sleeping.  Dry and itchy skin.  Nausea and vomiting.  Muscle cramps.  Shortness of breath.  How is this diagnosed? Your health care provider can diagnose proteinuria with a urine test. You may have this test as part of a routine physical or because you have symptoms of kidney disease or risk factors for kidney disease. You may also have:  Blood tests to measure the level of a certain substance (creatinine) that increases with kidney disease.  Imaging tests of your kidney, such as a CT scan or an ultrasound, to look for signs of kidney damage.  How is this treated? If your proteinuria is mild or temporary, no treatment may be needed. Your health care provider may show you how to monitor the level of protein in your urine at home. Identifying proteinuria early is important so that the cause of the condition can be treated. Treatment depends on the cause of your proteinuria. Treatment may include:  Making diet and lifestyle changes.  Getting blood pressure under control.  Getting blood sugar under control, if you have diabetes.  Managing any other medical conditions you have that affect your kidneys.  Giving birth, if you are pregnant.  Avoiding medicines that damage your kidneys.  Treating kidney disease with medicine  and dialysis, as needed.  Follow these instructions at home:  Check your protein levels at home, if directed by your health care provider.  Follow instructions from your health care provider about eating or drinking restrictions.  Take over-the-counter  and prescription medicines only as told by your health care provider.  Return to your normal activities as told by your health care provider. Ask your health care provider what activities are safe for you.  If you are overweight, ask your health care provider to help you with a diet to get to a healthy weight.  Ask your health care provider to help you with an exercise program.  Keep all follow-up visits as told by your health care provider. This is important. Contact a health care provider if:  You have new symptoms.  Your symptoms get worse or do not improve. Get help right away if:  You have back pain.  You have diarrhea.  You vomit.  You have a fever.  You have a rash. This information is not intended to replace advice given to you by your health care provider. Make sure you discuss any questions you have with your health care provider. Document Released: 05/12/2005 Document Revised: 05/02/2015 Document Reviewed: 02/10/2015 Elsevier Interactive Patient Education  2018 Elsevier Inc.  

## 2018-02-15 NOTE — Progress Notes (Signed)
HPI:                                                                Douglas Wilkinson is a 42 y.o. male who presents [facility]: [facility] today for diabetes follow-up  DMII: new diagnosis 4 months ago. Presented with polyuria. He was switched to Metformin XR 1000 mg QHS, and his GI upset and side effects have resolved. Compliant with medications.  He saw the diabetes educator in August and has been making aggressive lifestyle changes. Follows a very low carb diet and does not eat any refined sugar. He reports he is down about 45 pounds At last OV he was noted to have microalbuminuria and was prescribed Lisinopril. Reports he has not started this because "I'm not a pill person." Denies polydipsia, polyuria, polyphagia. Denies blurred vision or vision change. Denies extremity pain, altered sensation and paresthesias.  Denies ulcers/wounds on feet. Hx of DKA/HHS: no Diabetes associated symptoms: none Blood glucose readings: FBG's 99-119 Hypoglycemia frequency: none Severe hypoglycemia (requiring 3rd party assistance): none   OSA:  [place] 2[place] ago [place] Has not been using his CPAP for months stating "I feel like I'm being blown away." He is interested in a different mask.    Past Medical History:  Diagnosis Date  . OSA on CPAP    Past Surgical History:  Procedure Laterality Date  . KNEE ARTHROSCOPY Right   . REFRACTIVE SURGERY    . VASECTOMY     Social History   Tobacco Use  . Smoking status: Never Smoker  . Smokeless tobacco: Never Used  Substance Use Topics  . Alcohol use: Not Currently   family history includes Heart attack in his father; Skin cancer in his maternal grandfather.    ROS: negative except as noted in the HPI  Medications: Current Outpatient Medications  Medication Sig Dispense Refill  . atorvastatin (LIPITOR) 20 MG tablet Take 1 tablet (20 mg total) by mouth daily. Due for follow up visit 30 tablet 0   . lisinopril (PRINIVIL,ZESTRIL) 5 MG tablet Take 1 tablet (5 mg total) by mouth daily. 30 tablet 5  . metFORMIN (GLUCOPHAGE XR) 500 MG 24 hr tablet Take 2 tablets (1,000 mg total) by mouth daily with supper. 180 tablet 0  . NONFORMULARY OR COMPOUNDED ITEM Auto-titrate 4-20 cm H2O. Download in 1 week 1 each 0  . NONFORMULARY OR COMPOUNDED ITEM DreamWear Gel Nasal Pillow CPAP Mask with Headgear by [employer], for nightly use with CPAP 1 each 0   No current facility-administered medications for this visit.    No Known Allergies     Objective:  BP 130/84   Pulse 66   Wt 223 lb (101.2 kg)   BMI 32.00 kg/m  Gen:  alert, not ill-appearing, no distress, appropriate for age, obese male HEENT: head normocephalic without obvious abnormality, conjunctiva and cornea clear, trachea midline Pulm: Normal work of breathing, normal phonation, clear to auscultation bilaterally, no wheezes, rales or rhonchi CV: Normal rate, regular rhythm, s1 and s2 distinct, no murmurs, clicks or rubs  Neuro: alert and oriented x 3, no tremor MSK: extremities atraumatic, normal gait and station Skin: intact, no rashes on exposed skin, no jaundice, no cyanosis Psych: well-groomed, cooperative, good  eye contact, euthymic mood, affect mood-congruent, speech is articulate, and thought processes clear and goal-directed  Lab Results  Component Value Date   CREATININE 1.13 10/14/2017   BUN 15 10/14/2017   NA 135 10/14/2017   K 4.2 10/14/2017   CL 99 10/14/2017   CO2 25 10/14/2017     Results for orders placed or performed in visit on 02/15/18 (from the past 72 hour(s))  POCT HgB A1C     Status: Normal   Collection Time: 02/15/18  2:12 PM  Result Value Ref Range   Hemoglobin A1C     HbA1c POC (<> result, manual entry) 5.2 4.0 - 5.6 %   HbA1c, POC (prediabetic range)     HbA1c, POC (controlled diabetic range)     No results found.    Assessment and Plan: 42 y.o. male with   .Douglas Wilkinson was seen today  for hyperglycemia.  Diagnoses and all orders for this visit:  Controlled type 2 diabetes mellitus with microalbuminuria, without long-term current use of insulin (HCC) -     POCT HgB A1C  OSA on CPAP -     NONFORMULARY OR COMPOUNDED ITEM; Auto-titrate 4-20 cm H2O. Download in 1 week -     NONFORMULARY OR COMPOUNDED ITEM; DreamWear Gel Nasal Pillow CPAP Mask with Headgear by [employer], for nightly use with CPAP  Microalbuminuria due to type 2 diabetes mellitus (HCC)   A1C very well controlled on monotherapy and aggressive lifestyle changes. Applauded for his weight loss. Discussed option to decrease Metformin XR to 500 mg, but he would like to continue current dose until he is at his goal weight He is amenable to starting Lisinopril today for microalbuminuria - BP goal <130/80 - LDL goal <70, cont Atorvastatin 20 mg - foot exam UTD - discussed diabetes preventive care - declined influenza and pneumococcal vaccines  OSA - reviewed mask options with patient. Order placed for new nasal pillow mask - order sent to [employer] for auto-titrate and download in 1 week  Patient education and anticipatory guidance given Patient agrees with treatment plan Follow-up in 6 months for med mgmt or sooner as needed if symptoms worsen or fail to improve  Levonne Hubert PA-C

## 2018-02-16 ENCOUNTER — Encounter: Payer: Self-pay | Admitting: Physician Assistant

## 2018-02-24 ENCOUNTER — Other Ambulatory Visit: Payer: Self-pay | Admitting: Physician Assistant

## 2018-02-24 DIAGNOSIS — E1165 Type 2 diabetes mellitus with hyperglycemia: Secondary | ICD-10-CM

## 2018-03-10 ENCOUNTER — Other Ambulatory Visit: Payer: Self-pay | Admitting: Physician Assistant

## 2018-03-10 DIAGNOSIS — E782 Mixed hyperlipidemia: Secondary | ICD-10-CM

## 2018-05-14 ENCOUNTER — Other Ambulatory Visit: Payer: Self-pay | Admitting: Physician Assistant

## 2018-05-14 DIAGNOSIS — R809 Proteinuria, unspecified: Principal | ICD-10-CM

## 2018-05-14 DIAGNOSIS — E1129 Type 2 diabetes mellitus with other diabetic kidney complication: Secondary | ICD-10-CM

## 2018-05-23 ENCOUNTER — Other Ambulatory Visit: Payer: Self-pay | Admitting: Osteopathic Medicine

## 2018-05-23 DIAGNOSIS — E1165 Type 2 diabetes mellitus with hyperglycemia: Secondary | ICD-10-CM

## 2018-05-29 DIAGNOSIS — M25522 Pain in left elbow: Secondary | ICD-10-CM | POA: Diagnosis not present

## 2018-06-02 DIAGNOSIS — M25522 Pain in left elbow: Secondary | ICD-10-CM | POA: Diagnosis not present

## 2018-06-10 ENCOUNTER — Other Ambulatory Visit: Payer: Self-pay | Admitting: Physician Assistant

## 2018-06-10 DIAGNOSIS — E782 Mixed hyperlipidemia: Secondary | ICD-10-CM

## 2018-06-12 DIAGNOSIS — M25522 Pain in left elbow: Secondary | ICD-10-CM | POA: Diagnosis not present

## 2018-08-12 ENCOUNTER — Other Ambulatory Visit: Payer: Self-pay | Admitting: Physician Assistant

## 2018-08-12 DIAGNOSIS — E1129 Type 2 diabetes mellitus with other diabetic kidney complication: Secondary | ICD-10-CM

## 2018-08-24 ENCOUNTER — Other Ambulatory Visit: Payer: Self-pay

## 2018-08-24 DIAGNOSIS — E1165 Type 2 diabetes mellitus with hyperglycemia: Secondary | ICD-10-CM

## 2018-08-24 MED ORDER — METFORMIN HCL ER 500 MG PO TB24: 500.0000 mg | ORAL_TABLET | Freq: Every day | ORAL | 0 refills | Status: DC

## 2018-09-08 ENCOUNTER — Other Ambulatory Visit: Payer: Self-pay | Admitting: Physician Assistant

## 2018-09-08 DIAGNOSIS — E1129 Type 2 diabetes mellitus with other diabetic kidney complication: Secondary | ICD-10-CM

## 2018-09-08 DIAGNOSIS — E782 Mixed hyperlipidemia: Secondary | ICD-10-CM

## 2018-09-23 ENCOUNTER — Other Ambulatory Visit: Payer: Self-pay | Admitting: Physician Assistant

## 2018-09-23 DIAGNOSIS — E1165 Type 2 diabetes mellitus with hyperglycemia: Secondary | ICD-10-CM

## 2018-10-24 ENCOUNTER — Other Ambulatory Visit: Payer: Self-pay | Admitting: Physician Assistant

## 2018-10-24 DIAGNOSIS — E1165 Type 2 diabetes mellitus with hyperglycemia: Secondary | ICD-10-CM

## 2018-11-07 DIAGNOSIS — H029 Unspecified disorder of eyelid: Secondary | ICD-10-CM | POA: Diagnosis not present

## 2018-11-24 ENCOUNTER — Ambulatory Visit (INDEPENDENT_AMBULATORY_CARE_PROVIDER_SITE_OTHER): Payer: BC Managed Care – PPO | Admitting: Physician Assistant

## 2018-11-24 ENCOUNTER — Encounter: Payer: Self-pay | Admitting: Physician Assistant

## 2018-11-24 ENCOUNTER — Other Ambulatory Visit: Payer: Self-pay

## 2018-11-24 VITALS — BP 110/70 | HR 53 | Temp 98.0°F | Ht 70.0 in | Wt 221.0 lb

## 2018-11-24 DIAGNOSIS — Z23 Encounter for immunization: Secondary | ICD-10-CM | POA: Diagnosis not present

## 2018-11-24 DIAGNOSIS — E1129 Type 2 diabetes mellitus with other diabetic kidney complication: Secondary | ICD-10-CM | POA: Diagnosis not present

## 2018-11-24 DIAGNOSIS — E785 Hyperlipidemia, unspecified: Secondary | ICD-10-CM

## 2018-11-24 DIAGNOSIS — K760 Fatty (change of) liver, not elsewhere classified: Secondary | ICD-10-CM

## 2018-11-24 DIAGNOSIS — E1165 Type 2 diabetes mellitus with hyperglycemia: Secondary | ICD-10-CM | POA: Diagnosis not present

## 2018-11-24 DIAGNOSIS — E1169 Type 2 diabetes mellitus with other specified complication: Secondary | ICD-10-CM

## 2018-11-24 DIAGNOSIS — E782 Mixed hyperlipidemia: Secondary | ICD-10-CM | POA: Diagnosis not present

## 2018-11-24 DIAGNOSIS — R809 Proteinuria, unspecified: Secondary | ICD-10-CM

## 2018-11-24 LAB — POCT UA - MICROALBUMIN
Albumin/Creatinine Ratio, Urine, POC: <30
Creatinine, POC: 100 mg/dL
Microalbumin Ur, POC: 10 mg/L

## 2018-11-24 LAB — POCT GLYCOSYLATED HEMOGLOBIN (HGB A1C): Hemoglobin A1C: 5.5 % (ref 4.0–5.6)

## 2018-11-24 MED ORDER — ATORVASTATIN CALCIUM 20 MG PO TABS: ORAL_TABLET | ORAL | 0 refills | Status: DC

## 2018-11-24 MED ORDER — METFORMIN HCL ER 500 MG PO TB24: ORAL_TABLET | ORAL | 0 refills | Status: DC

## 2018-11-24 MED ORDER — LISINOPRIL 5 MG PO TABS: ORAL_TABLET | ORAL | 0 refills | Status: DC

## 2018-11-24 NOTE — Progress Notes (Signed)
HPI:                                                                Douglas Wilkinson is a 43 y.o. male who [facility] today for diabetes follow-up  DMII:new diagnosis 2019. Presented with polyuria. He saw the diabetes educator in August 2019 and has made aggressive lifestyle changes and was able to reduce Metformin XR to 500 mg.Compliant with medications. He has maintained his weight loss. He has been taking Lisinopril 5 mg for microalbuminuria Denies polydipsia, polyuria, polyphagia. Denies blurred vision or vision change. Denies extremity pain, altered sensation and paresthesias. Denies ulcers/wounds on feet. Hx of DKA/HHS:no Diabetes associated symptoms:none Blood glucose readings:none to report Hypoglycemia frequency:none Severe hypoglycemia (requiring 3rd party assistance):none   OSA:  wears his CPAP 2-3 times per week for 4-6 hrs. He states if he eats healthy he does not need his CPAP and he wakes up feeling rested.   [facility] 11/24/2018 12/01/2017 11/15/2017  Decreased Interest 0 0 0  Down, Depressed, Hopeless 0 0 0  PHQ - 2 Score 0 0 0  Altered sleeping - - 0  Tired, decreased energy - - 2  Change in appetite - - 0  Feeling bad or failure about yourself  - - 0  Trouble concentrating - - 0  Moving slowly or fidgety/restless - - 0  Suicidal thoughts - - 0  PHQ-9 Score - - 2  Difficult doing work/chores - - Not difficult at all     Past Medical History:  Diagnosis Date  . Diabetes mellitus without complication (HCC)   . Microalbuminuria due to type 2 diabetes mellitus (HCC)   . OSA on CPAP    Past Surgical History:  Procedure Laterality Date  . KNEE ARTHROSCOPY Right   . REFRACTIVE SURGERY    . VASECTOMY     Social History   Tobacco Use  . Smoking status: Never Smoker  . Smokeless tobacco: Never Used  Substance Use Topics  . Alcohol use: Not Currently   family history  includes Heart attack in his father; Skin cancer in his maternal grandfather.    ROS: negative except as noted in the HPI  Medications: Current Outpatient Medications  Medication Sig Dispense Refill  . atorvastatin (LIPITOR) 20 MG tablet TAKE 1 TABLET BY MOUTH DAILY AT 6 PM 90 tablet 0  . lisinopril (ZESTRIL) 5 MG tablet TAKE 1 TABLET BY MOUTH DAILY. 90 tablet 0  . metFORMIN (GLUCOPHAGE-XR) 500 MG 24 hr tablet TAKE 1 TABLET BY MOUTH DAILY WITH BREAKFAST. 90 tablet 0  . NONFORMULARY OR COMPOUNDED ITEM Auto-titrate 4-20 cm H2O. Download in 1 week 1 each 0  . NONFORMULARY OR COMPOUNDED ITEM DreamWear Gel Nasal Pillow CPAP Mask with Headgear by [employer], for nightly use with CPAP 1 each 0   No current facility-administered medications for this visit.    No Known Allergies     Objective:  BP 110/70   Pulse (!) 53   Temp 98 F (36.7 C)   Ht 5\' 10"  (1.778 m)   Wt 221 lb (100.2 kg)   SpO2 99%   BMI 31.71 kg/m   Wt Readings from Last 3 Encounters:  11/24/18 221 lb (  100.2 kg)  02/15/18 223 lb (101.2 kg)  01/14/18 223 lb (101.2 kg)   Temp Readings from Last 3 Encounters:  11/24/18 98 F (36.7 C)  01/14/18 98.3 F (36.8 C)  11/15/17 97.8 F (36.6 C) (Oral)   BP Readings from Last 3 Encounters:  11/24/18 110/70  02/15/18 130/84  01/14/18 (!) 143/88   Pulse Readings from Last 3 Encounters:  11/24/18 (!) 53  02/15/18 66  01/14/18 68    Gen:  alert, not ill-appearing, no distress, appropriate for age, overweight male HEENT: head normocephalic without obvious abnormality, conjunctiva and cornea clear, trachea midline Pulm: Normal work of breathing, normal phonation, clear to auscultation bilaterally, no wheezes, rales or rhonchi CV: bradycardic rate, regular rhythm, s1 and s2 distinct, no murmurs, clicks or rubs; no carotid bruit  Neuro: alert and oriented x 3, no tremor MSK: extremities atraumatic, normal gait and station, no peripheral edema Skin: intact, no  rashes on exposed skin, no jaundice, no cyanosis Psych: well-groomed, cooperative, good eye contact, euthymic mood, affect mood-congruent, speech is articulate, and thought processes clear and goal-directed  Diabetic Foot Exam - Simple   Simple Foot Form Diabetic Foot exam was performed with the following findings: Yes 11/24/2018 11:44 AM  Visual Inspection No deformities, no ulcerations, no other skin breakdown bilaterally: Yes Sensation Testing Intact to touch and monofilament testing bilaterally: Yes Pulse Check Posterior Tibialis and Dorsalis pulse intact bilaterally: Yes Comments      Lab Results  Component Value Date   CREATININE 1.13 10/14/2017   BUN 15 10/14/2017   NA 135 10/14/2017   K 4.2 10/14/2017   CL 99 10/14/2017   CO2 25 10/14/2017   Lab Results  Component Value Date   ALT 114 (H) 10/14/2017   AST 56 (H) 10/14/2017   BILITOT 0.8 10/14/2017   Lab Results  Component Value Date   WBC 6.1 10/14/2017   HGB 16.4 10/14/2017   HCT 45.7 10/14/2017   MCV 87.2 10/14/2017   PLT 210 10/14/2017   Lab Results  Component Value Date   CHOL 111 11/16/2017   HDL 27 (L) 11/16/2017   LDLCALC 63 11/16/2017   LDLDIRECT CANCELED 10/14/2017   TRIG 124 11/16/2017   CHOLHDL 4.1 11/16/2017   Recent Results (from the past 2160 hour(s))  POCT glycosylated hemoglobin (Hb A1C)     Status: Normal   Collection Time: 11/24/18 11:28 AM  Result Value Ref Range   Hemoglobin A1C 5.5 4.0 - 5.6 %   HbA1c POC (<> result, manual entry)     HbA1c, POC (prediabetic range)     HbA1c, POC (controlled diabetic range)    POCT UA - Microalbumin     Status: Normal   Collection Time: 11/24/18 11:28 AM  Result Value Ref Range   Microalbumin Ur, POC 10 mg/L   Creatinine, POC 100 mg/dL   Albumin/Creatinine Ratio, Urine, POC <30     Comment: normal     Assessment and Plan: 43 y.o. male with   .[name] was seen today for diabetes.  Diagnoses and all orders for this visit:  Type 2  diabetes mellitus with hyperglycemia, without long-term current use of insulin (HCC) -     POCT glycosylated hemoglobin (Hb A1C) -     POCT UA - Microalbumin -     metFORMIN (GLUCOPHAGE-XR) 500 MG 24 hr tablet; TAKE 1 TABLET BY MOUTH DAILY WITH BREAKFAST. -     BASIC METABOLIC PANEL WITH GFR -     CBC  Mixed  hypercholesterolemia and hypertriglyceridemia -     POCT UA - Microalbumin -     atorvastatin (LIPITOR) 20 MG tablet; TAKE 1 TABLET BY MOUTH DAILY AT 6 PM -     Lipid Panel w/reflex Direct LDL  Microalbuminuria due to type 2 diabetes mellitus (HCC) -     POCT UA - Microalbumin -     lisinopril (ZESTRIL) 5 MG tablet; TAKE 1 TABLET BY MOUTH DAILY. -     BASIC METABOLIC PANEL WITH GFR  Hyperlipidemia associated with type 2 diabetes mellitus (HCC) -     atorvastatin (LIPITOR) 20 MG tablet; TAKE 1 TABLET BY MOUTH DAILY AT 6 PM -     Lipid Panel w/reflex Direct LDL  Hepatic steatosis -     Hepatic function panel -     CBC  Need for diphtheria-tetanus-pertussis (Tdap) vaccine -     Tdap vaccine greater than or equal to 7yo IM   Type 2 DM Well controlled with low-dose Metformin Lab Results  Component Value Date   HGBA1C 5.5 11/24/2018  BP at goal <130/80 LDL goal <70, cont statin, fasting lipids pending Urine microalbumin: improved from 80 mg/L to 10 mg/L today, cont low dose ACE Diabetic foot exam: performed today and normal Diabetic eye exam: UTD per patient, [facility] Renal function pending  Hepatic steatosis Recheck fasting LFT's today He has continued to lose a little weight Continue low fat diet and regular aerobic exercise  Patient education and anticipatory guidance given Patient agrees with treatment plan Follow-up in 6 months or sooner as needed if symptoms worsen or fail to improve  Levonne Hubertharley E. Jacqulin Brandenburger PA-C

## 2018-11-24 NOTE — Patient Instructions (Signed)
Diabetes Preventive Care: - annual foot exam, self foot exams at least weekly - annual dilated eye exam with an eye doctor - pneumonia vaccine once (booster in 5 years and at age 43) - annual influenza vaccine - twice yearly dental cleanings and yearly exam - blood pressure goal <130/80 - LDL cholesterol goal <70 - A1C goal <7.0 - body mass index (BMI) less than or equal to 29.0 (ideally below 25) - follow-up every 3 months if your A1C is not at goal - follow-up every 6 months if diabetes is well controlled  

## 2018-11-25 LAB — CBC
HCT: 44.6 % (ref 38.5–50.0)
Hemoglobin: 15.2 g/dL (ref 13.2–17.1)
MCH: 30.8 pg (ref 27.0–33.0)
MCHC: 34.1 g/dL (ref 32.0–36.0)
MCV: 90.5 fL (ref 80.0–100.0)
MPV: 10.4 fL (ref 7.5–12.5)
Platelets: 212 10*3/uL (ref 140–400)
RBC: 4.93 10*6/uL (ref 4.20–5.80)
RDW: 12.4 % (ref 11.0–15.0)
WBC: 5.3 10*3/uL (ref 3.8–10.8)

## 2018-11-25 LAB — BASIC METABOLIC PANEL WITH GFR
BUN: 20 mg/dL (ref 7–25)
CO2: 29 mmol/L (ref 20–32)
Calcium: 9.6 mg/dL (ref 8.6–10.3)
Chloride: 105 mmol/L (ref 98–110)
Creat: 1.29 mg/dL (ref 0.60–1.35)
GFR, Est African American: 78 mL/min/{1.73_m2} (ref >=60)
GFR, Est Non African American: 67 mL/min/{1.73_m2} (ref >=60)
Glucose, Bld: 104 mg/dL — ABNORMAL HIGH (ref 65–99)
Potassium: 4.4 mmol/L (ref 3.5–5.3)
Sodium: 142 mmol/L (ref 135–146)

## 2018-11-25 LAB — LIPID PANEL W/REFLEX DIRECT LDL
Cholesterol: 140 mg/dL (ref <200)
HDL: 37 mg/dL — ABNORMAL LOW (ref >=40)
LDL Cholesterol (Calc): 85 mg/dL (calc)
Non-HDL Cholesterol (Calc): 103 mg/dL (calc) (ref <130)
Total CHOL/HDL Ratio: 3.8 (calc) (ref <5.0)
Triglycerides: 90 mg/dL (ref <150)

## 2018-11-25 LAB — HEPATIC FUNCTION PANEL
AG Ratio: 1.8 (calc) (ref 1.0–2.5)
ALT: 22 U/L (ref 9–46)
AST: 17 U/L (ref 10–40)
Albumin: 4.6 g/dL (ref 3.6–5.1)
Alkaline phosphatase (APISO): 47 U/L (ref 36–130)
Bilirubin, Direct: 0.1 mg/dL (ref 0.0–0.2)
Globulin: 2.5 g/dL (calc) (ref 1.9–3.7)
Indirect Bilirubin: 0.4 mg/dL (calc) (ref 0.2–1.2)
Total Bilirubin: 0.5 mg/dL (ref 0.2–1.2)
Total Protein: 7.1 g/dL (ref 6.1–8.1)

## 2019-01-25 ENCOUNTER — Other Ambulatory Visit: Payer: Self-pay

## 2019-01-25 DIAGNOSIS — Z20822 Contact with and (suspected) exposure to covid-19: Secondary | ICD-10-CM

## 2019-01-28 LAB — NOVEL CORONAVIRUS, NAA: SARS-CoV-2, NAA: NOT DETECTED

## 2019-02-19 ENCOUNTER — Encounter (HOSPITAL_COMMUNITY): Payer: Self-pay | Admitting: Emergency Medicine

## 2019-02-19 ENCOUNTER — Emergency Department (HOSPITAL_COMMUNITY): Payer: BC Managed Care – PPO

## 2019-02-19 ENCOUNTER — Other Ambulatory Visit: Payer: Self-pay

## 2019-02-19 ENCOUNTER — Emergency Department (HOSPITAL_COMMUNITY)
Admission: EM | Admit: 2019-02-19 | Discharge: 2019-02-19 | Disposition: A | Discharge status: Home / Self Care | Payer: BC Managed Care – PPO | Attending: Emergency Medicine | Admitting: Emergency Medicine

## 2019-02-19 DIAGNOSIS — N50811 Right testicular pain: Secondary | ICD-10-CM | POA: Diagnosis not present

## 2019-02-19 DIAGNOSIS — E119 Type 2 diabetes mellitus without complications: Secondary | ICD-10-CM | POA: Diagnosis not present

## 2019-02-19 DIAGNOSIS — Z79899 Other long term (current) drug therapy: Secondary | ICD-10-CM | POA: Insufficient documentation

## 2019-02-19 DIAGNOSIS — N201 Calculus of ureter: Secondary | ICD-10-CM | POA: Insufficient documentation

## 2019-02-19 DIAGNOSIS — N132 Hydronephrosis with renal and ureteral calculous obstruction: Secondary | ICD-10-CM | POA: Diagnosis not present

## 2019-02-19 DIAGNOSIS — Z7984 Long term (current) use of oral hypoglycemic drugs: Secondary | ICD-10-CM | POA: Diagnosis not present

## 2019-02-19 LAB — URINALYSIS, ROUTINE W REFLEX MICROSCOPIC
Bacteria, UA: NONE SEEN
Bilirubin Urine: NEGATIVE
Glucose, UA: NEGATIVE mg/dL
Ketones, ur: 5 mg/dL — AB
Leukocytes,Ua: NEGATIVE
Nitrite: NEGATIVE
Protein, ur: NEGATIVE mg/dL
RBC / HPF: >50 RBC/hpf — ABNORMAL HIGH (ref 0–5)
Specific Gravity, Urine: 1.019 (ref 1.005–1.030)
pH: 5 (ref 5.0–8.0)

## 2019-02-19 LAB — CBC
HCT: 44.3 % (ref 39.0–52.0)
Hemoglobin: 15.4 g/dL (ref 13.0–17.0)
MCH: 31 pg (ref 26.0–34.0)
MCHC: 34.8 g/dL (ref 30.0–36.0)
MCV: 89.1 fL (ref 80.0–100.0)
Platelets: 221 10*3/uL (ref 150–400)
RBC: 4.97 MIL/uL (ref 4.22–5.81)
RDW: 12.2 % (ref 11.5–15.5)
WBC: 8.1 10*3/uL (ref 4.0–10.5)
nRBC: 0 % (ref 0.0–0.2)

## 2019-02-19 LAB — HEPATIC FUNCTION PANEL
ALT: 35 U/L (ref 0–44)
AST: 26 U/L (ref 15–41)
Albumin: 4.4 g/dL (ref 3.5–5.0)
Alkaline Phosphatase: 50 U/L (ref 38–126)
Bilirubin, Direct: 0.1 mg/dL (ref 0.0–0.2)
Indirect Bilirubin: 0.6 mg/dL (ref 0.3–0.9)
Total Bilirubin: 0.7 mg/dL (ref 0.3–1.2)
Total Protein: 7.4 g/dL (ref 6.5–8.1)

## 2019-02-19 LAB — BASIC METABOLIC PANEL
Anion gap: 13 (ref 5–15)
BUN: 25 mg/dL — ABNORMAL HIGH (ref 6–20)
CO2: 23 mmol/L (ref 22–32)
Calcium: 9.2 mg/dL (ref 8.9–10.3)
Chloride: 103 mmol/L (ref 98–111)
Creatinine, Ser: 1.36 mg/dL — ABNORMAL HIGH (ref 0.61–1.24)
GFR calc Af Amer: >60 mL/min (ref >60)
GFR calc non Af Amer: >60 mL/min (ref >60)
Glucose, Bld: 146 mg/dL — ABNORMAL HIGH (ref 70–99)
Potassium: 4.1 mmol/L (ref 3.5–5.1)
Sodium: 139 mmol/L (ref 135–145)

## 2019-02-19 LAB — LIPASE, BLOOD: Lipase: 143 U/L — ABNORMAL HIGH (ref 11–51)

## 2019-02-19 MED ORDER — NAPROXEN 500 MG PO TABS: 500.0000 mg | ORAL_TABLET | Freq: Two times a day (BID) | ORAL | 0 refills | Status: DC

## 2019-02-19 NOTE — ED Provider Notes (Signed)
[facility] EMERGENCY DEPARTMENT Provider Note   CSN: [id number] Arrival date & time: 02/19/19  0554     History   Chief Complaint Chief Complaint  Patient presents with   Abdominal Pain   Testicle Pain   Back Pain    HPI Douglas Wilkinson is a 43 y.o. male.     Patient with acute onset of right testicular pain right flank pain 3:00 this morning.  Associated with nausea but no vomiting.  Had some pain in the right back area as well.  Pain was originally 10 out of 10.  Now pretty minimal more like 5 to 4 out of 10 patient much more comfortable.  Offered him pain medicine but he did not want any.  Patient does not have a past history of kidney stones.  From triage patient had an ultrasound done of his testicles I think due to the testicle pain.  There was no acute abnormalities there.  Renal function from labs done in triage without any significant abnormalities.     Past Medical History:  Diagnosis Date   Diabetes mellitus without complication (HCC)    Microalbuminuria due to type 2 diabetes mellitus (HCC)    OSA on CPAP     Patient Active Problem List   Diagnosis Date Noted   Refused pneumococcal vaccination 11/26/2017   Microalbuminuria due to type 2 diabetes mellitus (HCC) 11/15/2017   Hepatic steatosis 10/28/2017   Glucosuria 10/23/2017   Microscopic hematuria 10/23/2017   Type 2 diabetes mellitus with hyperglycemia, without long-term current use of insulin (HCC) 10/23/2017   Mixed hypercholesterolemia and hypertriglyceridemia 10/23/2017   Transaminitis 10/23/2017   Elevated alkaline phosphatase level 10/23/2017   OSA on CPAP     Past Surgical History:  Procedure Laterality Date   KNEE ARTHROSCOPY Right    REFRACTIVE SURGERY     VASECTOMY          Home Medications    Prior to Admission medications   Medication Sig Start Date End Date Taking? Authorizing Provider  atorvastatin (LIPITOR) 20 MG tablet TAKE 1 TABLET BY  MOUTH DAILY AT 6 PM 11/24/18   Carlis Stable, PA-C  lisinopril (ZESTRIL) 5 MG tablet TAKE 1 TABLET BY MOUTH DAILY. 11/24/18   Carlis Stable, PA-C  metFORMIN (GLUCOPHAGE-XR) 500 MG 24 hr tablet TAKE 1 TABLET BY MOUTH DAILY WITH BREAKFAST. 11/24/18   Carlis Stable, PA-C  naproxen (NAPROSYN) 500 MG tablet Take 1 tablet (500 mg total) by mouth 2 (two) times daily. 02/19/19   Vanetta Mulders, MD  NONFORMULARY OR COMPOUNDED ITEM Auto-titrate 4-20 cm H2O. Download in 1 week 02/15/18   Carlis Stable, PA-C  NONFORMULARY OR COMPOUNDED ITEM DreamWear Gel Nasal Pillow CPAP Mask with Headgear by [employer], for nightly use with CPAP 02/15/18   Carlis Stable, PA-C    Family History Family History  Problem Relation Age of Onset   Heart attack Father    Skin cancer Maternal Grandfather     Social History Social History   Tobacco Use   Smoking status: Never Smoker   Smokeless tobacco: Never Used  Substance Use Topics   Alcohol use: Not Currently   Drug use: Never     Allergies   Patient has no known allergies.   Review of Systems Review of Systems  Constitutional: Negative for chills and fever.  HENT: Negative for congestion, rhinorrhea and sore throat.   Eyes: Negative for visual disturbance.  Respiratory: Negative for cough and shortness  of breath.   Cardiovascular: Negative for chest pain and leg swelling.  Gastrointestinal: Positive for nausea. Negative for abdominal pain, diarrhea and vomiting.  Genitourinary: Positive for difficulty urinating and flank pain. Negative for dysuria and hematuria.  Musculoskeletal: Positive for back pain. Negative for neck pain.  Skin: Negative for rash.  Neurological: Negative for dizziness, light-headedness and headaches.  Hematological: Does not bruise/bleed easily.  Psychiatric/Behavioral: Negative for confusion.     Physical Exam Updated Vital Signs BP (!)  140/111 (BP Location: Right Arm)    Pulse (!) 56    Temp 97.9 F (36.6 C) (Oral)    Resp 16    Ht 1.778 m (5\' 10" )    Wt 97.5 kg    SpO2 100%    BMI 30.85 kg/m   Physical Exam Vitals signs and nursing note reviewed.  Constitutional:      General: He is not in acute distress.    Appearance: Normal appearance. He is well-developed.  HENT:     Head: Normocephalic and atraumatic.  Eyes:     Extraocular Movements: Extraocular movements intact.     Conjunctiva/sclera: Conjunctivae normal.     Pupils: Pupils are equal, round, and reactive to light.  Neck:     Musculoskeletal: Normal range of motion and neck supple.  Cardiovascular:     Rate and Rhythm: Normal rate and regular rhythm.     Heart sounds: No murmur.  Pulmonary:     Effort: Pulmonary effort is normal. No respiratory distress.     Breath sounds: Normal breath sounds.  Abdominal:     Palpations: Abdomen is soft.     Tenderness: There is no abdominal tenderness.  Musculoskeletal: Normal range of motion.  Skin:    General: Skin is warm and dry.  Neurological:     General: No focal deficit present.     Mental Status: He is alert and oriented to person, place, and time.      ED Treatments / Results  Labs (all labs ordered are listed, but only abnormal results are displayed) Labs Reviewed  URINALYSIS, ROUTINE W REFLEX MICROSCOPIC - Abnormal; Notable for the following components:      Result Value   APPearance HAZY (*)    Hgb urine dipstick LARGE (*)    Ketones, ur 5 (*)    RBC / HPF >50 (*)    All other components within normal limits  BASIC METABOLIC PANEL - Abnormal; Notable for the following components:   Glucose, Bld 146 (*)    BUN 25 (*)    Creatinine, Ser 1.36 (*)    All other components within normal limits  LIPASE, BLOOD - Abnormal; Notable for the following components:   Lipase 143 (*)    All other components within normal limits  CBC  HEPATIC FUNCTION PANEL    EKG None  Radiology Ct Renal Stone  Study  Result Date: 02/19/2019 CLINICAL DATA:  Right abdomen/flank pain EXAM: CT ABDOMEN AND PELVIS WITHOUT CONTRAST TECHNIQUE: Multidetector CT imaging of the abdomen and pelvis was performed following the standard protocol without oral or IV contrast. COMPARISON:  None. FINDINGS: Lower chest: Lung bases are clear. Hepatobiliary: No focal liver lesions are evident on this noncontrast enhanced study. The gallbladder wall is not appreciably thickened. There is no biliary duct dilatation. Pancreas: There is no pancreatic mass or inflammatory focus. Spleen: No splenic lesions are evident. Adrenals/Urinary Tract: Adrenals bilaterally appear normal. There is no demonstrable renal mass on either side. There is slight hydronephrosis on  the right. There is no hydronephrosis on the left. There is no intrarenal calculus on either side. There is a 2 x 1 mm calculus at the right ureterovesical junction. No other ureteral calculi are evident. Urinary bladder is midline with wall thickness within normal limits. Stomach/Bowel: There is moderate stool in the colon. There is no appreciable bowel wall or mesenteric thickening. The terminal ileum appears unremarkable. There is no evident free air or portal venous air. Vascular/Lymphatic: There is no abdominal aortic aneurysm. No vascular lesions are evident on this noncontrast enhanced study. There is no evident adenopathy in the abdomen or pelvis. Reproductive: Prostate and seminal vesicles are normal in size and contour. There is no evident pelvic mass. Other: Appendix appears normal. No abscess or ascites is evident in the abdomen or pelvis. There is slight fat in the umbilicus. Musculoskeletal: No blastic or lytic bone lesions. No intramuscular lesions evident. IMPRESSION: 1. 2 x 1 mm calculus at the right ureterovesical junction with mild hydronephrosis on the right. 2. No bowel obstruction. No abscess in the abdomen or pelvis. Appendix appears normal. Electronically Signed    By: Bretta BangWilliam  Woodruff III M.D.   On: 02/19/2019 09:21   [place] Scrotum W/doppler  Result Date: 02/19/2019 CLINICAL DATA:  Right testicular pain for 3 days EXAM: SCROTAL ULTRASOUND DOPPLER ULTRASOUND OF THE TESTICLES TECHNIQUE: Complete ultrasound examination of the testicles, epididymis, and other scrotal structures was performed. Color and spectral Doppler ultrasound were also utilized to evaluate blood flow to the testicles. COMPARISON:  None. FINDINGS: Right testicle Measurements: 52 x 25 x 34 mm.  No mass or abnormal vascularity Left testicle Measurements:  47 x 24 x 35 mm.  No mass or abnormal vascularity. Right epididymis:  Normal in size and appearance. Left epididymis:  Normal in size and appearance. Hydrocele:  None visualized. Varicocele: None visualized. No veins dilate to 3 mm or greater during Valsalva. Pulsed Doppler interrogation of both testes demonstrates normal low resistance arterial and venous waveforms bilaterally. IMPRESSION: Negative testicular ultrasound. Electronically Signed   By: Marnee SpringJonathon  Watts M.D.   On: 02/19/2019 07:12    Procedures Procedures (including critical care time)  Medications Ordered in ED Medications - No data to display   Initial Impression / Assessment and Plan / ED Course  I have reviewed the triage vital signs and the nursing notes.  Pertinent labs & imaging results that were available during my care of the patient were reviewed by me and considered in my medical decision making (see chart for details).        CT renal consistent with distal right ureteral stone.  Almost in the bladder.  Patient feeling overall better.  Patient does not want narcotic pain medicine.  We will treat him with Naprosyn.  He is able to take that.  Can follow-up with urology.  Urinalysis just with some hematuria. Patient has a known history of diabetes.  Renal function normal.  CT scan did show a little bit of hydronephrosis on the right side.  Patient stable for discharge  home and follow-up with urology.  Final Clinical Impressions(s) / ED Diagnoses   Final diagnoses:  Right ureteral stone    ED Discharge Orders         Ordered    naproxen (NAPROSYN) 500 MG tablet  2 times daily     02/19/19 1007           Vanetta MuldersZackowski, Carriann Hesse, MD 02/19/19 1015

## 2019-02-19 NOTE — Discharge Instructions (Signed)
Take the Naprosyn as directed.  CT scan showed that you had a distal ureteral stone, kidney stone, should pass it shortly.  Take the Naprosyn as directed.  Make an appointment to follow-up with urology.  Return for any new or worse symptoms.

## 2019-02-19 NOTE — ED Triage Notes (Signed)
Pt reports lower abdominal pain, right testicle pain and right flank pain.  Pt reports this started about three hours ago, hurts more when he tries to urinate and "I can only pee a few drops."

## 2019-02-19 NOTE — ED Notes (Signed)
To CT scan

## 2019-02-20 DIAGNOSIS — R351 Nocturia: Secondary | ICD-10-CM | POA: Diagnosis not present

## 2019-02-20 DIAGNOSIS — R3912 Poor urinary stream: Secondary | ICD-10-CM | POA: Diagnosis not present

## 2019-02-20 DIAGNOSIS — R350 Frequency of micturition: Secondary | ICD-10-CM | POA: Diagnosis not present

## 2019-02-20 DIAGNOSIS — N201 Calculus of ureter: Secondary | ICD-10-CM | POA: Diagnosis not present

## 2019-02-23 ENCOUNTER — Other Ambulatory Visit: Payer: Self-pay | Admitting: Physician Assistant

## 2019-02-23 DIAGNOSIS — E785 Hyperlipidemia, unspecified: Secondary | ICD-10-CM

## 2019-02-23 DIAGNOSIS — E1165 Type 2 diabetes mellitus with hyperglycemia: Secondary | ICD-10-CM

## 2019-02-23 DIAGNOSIS — E782 Mixed hyperlipidemia: Secondary | ICD-10-CM

## 2019-02-23 DIAGNOSIS — E1169 Type 2 diabetes mellitus with other specified complication: Secondary | ICD-10-CM

## 2019-03-28 ENCOUNTER — Other Ambulatory Visit: Payer: Self-pay | Admitting: Neurology

## 2019-03-28 DIAGNOSIS — R809 Proteinuria, unspecified: Secondary | ICD-10-CM

## 2019-03-28 DIAGNOSIS — E1129 Type 2 diabetes mellitus with other diabetic kidney complication: Secondary | ICD-10-CM

## 2019-03-28 MED ORDER — LISINOPRIL 5 MG PO TABS: ORAL_TABLET | ORAL | 0 refills | Status: DC

## 2019-04-16 DIAGNOSIS — N401 Enlarged prostate with lower urinary tract symptoms: Secondary | ICD-10-CM | POA: Diagnosis not present

## 2019-04-16 DIAGNOSIS — R351 Nocturia: Secondary | ICD-10-CM | POA: Diagnosis not present

## 2019-05-07 DIAGNOSIS — E119 Type 2 diabetes mellitus without complications: Secondary | ICD-10-CM | POA: Diagnosis not present

## 2019-05-31 ENCOUNTER — Encounter: Payer: Self-pay | Admitting: Family Medicine

## 2019-05-31 ENCOUNTER — Other Ambulatory Visit: Payer: Self-pay

## 2019-05-31 ENCOUNTER — Ambulatory Visit (INDEPENDENT_AMBULATORY_CARE_PROVIDER_SITE_OTHER): Payer: BC Managed Care – PPO | Admitting: Family Medicine

## 2019-05-31 VITALS — BP 129/73 | HR 65 | Ht 70.0 in | Wt 229.0 lb

## 2019-05-31 DIAGNOSIS — R809 Proteinuria, unspecified: Secondary | ICD-10-CM

## 2019-05-31 DIAGNOSIS — E1129 Type 2 diabetes mellitus with other diabetic kidney complication: Secondary | ICD-10-CM | POA: Diagnosis not present

## 2019-05-31 DIAGNOSIS — E1169 Type 2 diabetes mellitus with other specified complication: Secondary | ICD-10-CM | POA: Diagnosis not present

## 2019-05-31 DIAGNOSIS — E1165 Type 2 diabetes mellitus with hyperglycemia: Secondary | ICD-10-CM | POA: Diagnosis not present

## 2019-05-31 DIAGNOSIS — E782 Mixed hyperlipidemia: Secondary | ICD-10-CM

## 2019-05-31 DIAGNOSIS — E785 Hyperlipidemia, unspecified: Secondary | ICD-10-CM

## 2019-05-31 LAB — POCT GLYCOSYLATED HEMOGLOBIN (HGB A1C): Hemoglobin A1C: 5.4 % (ref 4.0–5.6)

## 2019-05-31 MED ORDER — ATORVASTATIN CALCIUM 20 MG PO TABS: ORAL_TABLET | ORAL | 1 refills | Status: DC

## 2019-05-31 MED ORDER — LISINOPRIL 5 MG PO TABS: ORAL_TABLET | ORAL | 1 refills | Status: DC

## 2019-05-31 MED ORDER — METFORMIN HCL ER 500 MG PO TB24: 500.0000 mg | ORAL_TABLET | Freq: Every day | ORAL | 1 refills | Status: DC

## 2019-05-31 NOTE — Patient Instructions (Signed)
Nice to meet you today.   Continue current medications.  See me again in about 6 months.   

## 2019-05-31 NOTE — Progress Notes (Signed)
Douglas Wilkinson - 44 y.o. male MRN [id number]  Date of birth: 10/21/1975  Subjective Chief Complaint  Patient presents with  . Diabetes    HPI  Douglas Wilkinson is a 44 y.o. male with history of T2DM, HLD and OSA here today for follow up.   -T2DM: Current treatment with metformin.  He is doing well with this.  He has been working on dietary changes for weight loss. Blood sugars at home are well controlled.  He denies symptoms related to diabetes at this time.  He did have eye exam completed at [facility] in [place] a few weeks ago. He does not want pneumovax.     -HLD:  Current management with atorvastatin.  Lipids checked in 11/2018 were well controlled.  He is tolerating atorvastatin well. He denies myalgias.   ROS:  A comprehensive ROS was completed and negative except as noted per HPI  No Known Allergies  Past Medical History:  Diagnosis Date  . Diabetes mellitus without complication ([place])   . Microalbuminuria due to type 2 diabetes mellitus ([place])   . OSA on CPAP     Past Surgical History:  Procedure Laterality Date  . KNEE ARTHROSCOPY Right   . REFRACTIVE SURGERY    . VASECTOMY      Social History   Socioeconomic History  . Marital status: Married    Spouse name: Not on file  . Number of children: Not on file  . Years of education: Not on file  . Highest education level: Not on file  Occupational History  . Not on file  Tobacco Use  . Smoking status: Never Smoker  . Smokeless tobacco: Never Used  Substance and Sexual Activity  . Alcohol use: Not Currently  . Drug use: Never  . Sexual activity: Yes    Birth control/protection: None  Other Topics Concern  . Not on file  Social History Narrative  . Not on file   Social Determinants of Health   Financial Resource Strain:   . Difficulty of Paying Living Expenses: Not on file  Food Insecurity:   . Worried About Charity fundraiser in the Last Year: Not on file  . Ran Out of Food in the Last Year: Not on  file  Transportation Needs:   . Lack of Transportation (Medical): Not on file  . Lack of Transportation (Non-Medical): Not on file  Physical Activity:   . Days of Exercise per Week: Not on file  . Minutes of Exercise per Session: Not on file  Stress:   . Feeling of Stress : Not on file  Social Connections:   . Frequency of Communication with Friends and Family: Not on file  . Frequency of Social Gatherings with Friends and Family: Not on file  . Attends Religious Services: Not on file  . Active Member of Clubs or Organizations: Not on file  . Attends Archivist Meetings: Not on file  . Marital Status: Not on file    Family History  Problem Relation Age of Onset  . Heart attack Father   . Skin cancer Maternal Grandfather     Health Maintenance  Topic Date Due  . PNEUMOCOCCAL POLYSACCHARIDE VACCINE AGE 49-64 HIGH RISK  07/25/1976  . HIV Screening  07/25/1989  . OPHTHALMOLOGY EXAM  04/05/2018  . INFLUENZA VACCINE  07/04/2019 (Originally 11/04/2018)  . FOOT EXAM  11/24/2019  . HEMOGLOBIN A1C  11/28/2019  . TETANUS/TDAP  11/23/2028     ----------------------------------------------------------------------------------------------------------------------------------------------------------------------------------------------------------------- Physical Exam  BP 129/73   Pulse 65   Ht 5\' 10"  (1.778 m)   Wt 229 lb (103.9 kg)   BMI 32.86 kg/m   Physical Exam Constitutional:      Appearance: Normal appearance.  HENT:     Head: Normocephalic and atraumatic.  Eyes:     General: No scleral icterus. Cardiovascular:     Rate and Rhythm: Normal rate and regular rhythm.  Pulmonary:     Effort: Pulmonary effort is normal.     Breath sounds: Normal breath sounds.  Musculoskeletal:     Cervical back: Neck supple.  Skin:    General: Skin is warm and dry.  Neurological:     General: No focal deficit present.     Mental Status: He is alert.  Psychiatric:        Mood and  Affect: Mood normal.        Behavior: Behavior normal.     ------------------------------------------------------------------------------------------------------------------------------------------------------------------------------------------------------------------- Assessment and Plan  Type 2 diabetes mellitus with hyperglycemia, without long-term current use of insulin (HCC) Most recent A1c of  Lab Results  Component Value Date   HGBA1C 5.4 05/31/2019   indicates diabetes is well controlled.  He will continue metformin at current dose.  Counseled on healthy, low carb diet and recommend frequent activity to help with maintaining good control of blood sugars.  He has goal weight of 205 lbs.  Discussed we may be able to try him off metformin if blood sugars remain well controlled.    Mixed hypercholesterolemia and hypertriglyceridemia Lab Results  Component Value Date   LDLCALC 85 11/24/2018  Well controlled at this time. Continue atorvastatin.    Meds ordered this encounter  Medications  . atorvastatin (LIPITOR) 20 MG tablet    Sig: TAKE 1 TABLET BY MOUTH DAILY AT 6:00 PM    Dispense:  90 tablet    Refill:  1  . lisinopril (ZESTRIL) 5 MG tablet    Sig: TAKE 1 TABLET BY MOUTH DAILY.    Dispense:  90 tablet    Refill:  1  . metFORMIN (GLUCOPHAGE-XR) 500 MG 24 hr tablet    Sig: Take 1 tablet (500 mg total) by mouth daily with breakfast.    Dispense:  90 tablet    Refill:  1    Return in about 6 months (around 11/28/2019) for HTN/DM.    This visit occurred during the SARS-CoV-2 public health emergency.  Safety protocols were in place, including screening questions prior to the visit, additional usage of staff PPE, and extensive cleaning of exam room while observing appropriate contact time as indicated for disinfecting solutions.

## 2019-05-31 NOTE — Assessment & Plan Note (Signed)
Lab Results  Component Value Date   LDLCALC 85 11/24/2018  Well controlled at this time. Continue atorvastatin.

## 2019-05-31 NOTE — Assessment & Plan Note (Signed)
Most recent A1c of  Lab Results  Component Value Date   HGBA1C 5.4 05/31/2019   indicates diabetes is well controlled.  He will continue metformin at current dose.  Counseled on healthy, low carb diet and recommend frequent activity to help with maintaining good control of blood sugars.  He has goal weight of 205 lbs.  Discussed we may be able to try him off metformin if blood sugars remain well controlled.

## 2019-12-04 ENCOUNTER — Encounter: Payer: Self-pay | Admitting: Family Medicine

## 2019-12-04 ENCOUNTER — Ambulatory Visit (INDEPENDENT_AMBULATORY_CARE_PROVIDER_SITE_OTHER): Payer: BC Managed Care – PPO | Admitting: Family Medicine

## 2019-12-04 ENCOUNTER — Other Ambulatory Visit: Payer: Self-pay

## 2019-12-04 VITALS — BP 131/83 | HR 65 | Temp 98.4°F | Wt 227.0 lb

## 2019-12-04 DIAGNOSIS — E782 Mixed hyperlipidemia: Secondary | ICD-10-CM | POA: Diagnosis not present

## 2019-12-04 DIAGNOSIS — E1169 Type 2 diabetes mellitus with other specified complication: Secondary | ICD-10-CM

## 2019-12-04 DIAGNOSIS — E1129 Type 2 diabetes mellitus with other diabetic kidney complication: Secondary | ICD-10-CM | POA: Diagnosis not present

## 2019-12-04 DIAGNOSIS — E785 Hyperlipidemia, unspecified: Secondary | ICD-10-CM

## 2019-12-04 DIAGNOSIS — E1165 Type 2 diabetes mellitus with hyperglycemia: Secondary | ICD-10-CM | POA: Diagnosis not present

## 2019-12-04 DIAGNOSIS — R809 Proteinuria, unspecified: Secondary | ICD-10-CM

## 2019-12-04 MED ORDER — LISINOPRIL 5 MG PO TABS: ORAL_TABLET | ORAL | 2 refills | Status: DC

## 2019-12-04 MED ORDER — ATORVASTATIN CALCIUM 20 MG PO TABS: ORAL_TABLET | ORAL | 2 refills | Status: DC

## 2019-12-04 MED ORDER — METFORMIN HCL ER 500 MG PO TB24: 500.0000 mg | ORAL_TABLET | Freq: Every day | ORAL | 2 refills | Status: DC

## 2019-12-04 NOTE — Assessment & Plan Note (Signed)
He will continue metformin for now.  Fasting glucose readings at home have been good.  Update a1c and renal function today.  Continue lisinopril for history of microalbuminuria

## 2019-12-04 NOTE — Assessment & Plan Note (Signed)
He is tolerating atorvastatin well, continue at current strength.  Update lipid panel and LFT's

## 2019-12-04 NOTE — Progress Notes (Signed)
Douglas Wilkinson - 44 y.o. male MRN [id number]  Date of birth: 1975/08/05  Subjective Chief Complaint  Patient presents with  . Diabetes  . Hypertension    HPI Douglas Wilkinson is a 44 y.o. male here today for follow up visit.  He has history of  T2DM, HLD and OSA.    He continues to do well with metformin for management of diabetes.  He denies side effects related to medication.  He would like to get off of this if possible.  Reports fasting glucose readings in the upper 90's recently.  He is working on sticking to a healthy diet.  He does exercise a few days per week.  He is also on lisinopril for history of microalbuminuria.    Cholesterol has been treated with atorvastatin.  He has done well with this and denies myalgias.    ROS:  A comprehensive ROS was completed and negative except as noted per HPI   No Known Allergies  Past Medical History:  Diagnosis Date  . Diabetes mellitus without complication (HCC)   . Microalbuminuria due to type 2 diabetes mellitus (HCC)   . OSA on CPAP     Past Surgical History:  Procedure Laterality Date  . KNEE ARTHROSCOPY Right   . REFRACTIVE SURGERY    . VASECTOMY      Social History   Socioeconomic History  . Marital status: Married    Spouse name: Not on file  . Number of children: Not on file  . Years of education: Not on file  . Highest education level: Not on file  Occupational History  . Not on file  Tobacco Use  . Smoking status: Never Smoker  . Smokeless tobacco: Never Used  Vaping Use  . Vaping Use: Never used  Substance and Sexual Activity  . Alcohol use: Not Currently  . Drug use: Never  . Sexual activity: Yes    Birth control/protection: None  Other Topics Concern  . Not on file  Social History Narrative  . Not on file   Social Determinants of Health   Financial Resource Strain:   . Difficulty of Paying Living Expenses: Not on file  Food Insecurity:   . Worried About Programme researcher, broadcasting/film/video in the Last Year: Not on  file  . Ran Out of Food in the Last Year: Not on file  Transportation Needs:   . Lack of Transportation (Medical): Not on file  . Lack of Transportation (Non-Medical): Not on file  Physical Activity:   . Days of Exercise per Week: Not on file  . Minutes of Exercise per Session: Not on file  Stress:   . Feeling of Stress : Not on file  Social Connections:   . Frequency of Communication with Friends and Family: Not on file  . Frequency of Social Gatherings with Friends and Family: Not on file  . Attends Religious Services: Not on file  . Active Member of Clubs or Organizations: Not on file  . Attends Banker Meetings: Not on file  . Marital Status: Not on file    Family History  Problem Relation Age of Onset  . Heart attack Father   . Skin cancer Maternal Grandfather     Health Maintenance  Topic Date Due  . Hepatitis C Screening  Never done  . HIV Screening  Never done  . OPHTHALMOLOGY EXAM  04/05/2018  . INFLUENZA VACCINE  11/04/2019  . FOOT EXAM  11/24/2019  . HEMOGLOBIN A1C  11/28/2019  . PNEUMOCOCCAL POLYSACCHARIDE VACCINE AGE 42-64 HIGH RISK  12/03/2020 (Originally 07/25/1976)  . TETANUS/TDAP  11/23/2028  . COVID-19 Vaccine  Completed     ----------------------------------------------------------------------------------------------------------------------------------------------------------------------------------------------------------------- Physical Exam BP 131/83 (BP Location: Left Arm, Patient Position: Sitting, Cuff Size: Normal)   Pulse 65   Temp 98.4 F (36.9 C) (Temporal)   Wt 227 lb (103 kg)   SpO2 97%   BMI 32.57 kg/m   Physical Exam Constitutional:      Appearance: Normal appearance.  HENT:     Head: Normocephalic and atraumatic.  Cardiovascular:     Rate and Rhythm: Normal rate and regular rhythm.  Pulmonary:     Effort: Pulmonary effort is normal.     Breath sounds: Normal breath sounds.  Neurological:     Mental Status: He is  alert.  Psychiatric:        Mood and Affect: Mood normal.        Behavior: Behavior normal.     ------------------------------------------------------------------------------------------------------------------------------------------------------------------------------------------------------------------- Assessment and Plan  Type 2 diabetes mellitus with hyperglycemia, without long-term current use of insulin (HCC) He will continue metformin for now.  Fasting glucose readings at home have been good.  Update a1c and renal function today.  Continue lisinopril for history of microalbuminuria  Mixed hypercholesterolemia and hypertriglyceridemia He is tolerating atorvastatin well, continue at current strength.  Update lipid panel and LFT's   Meds ordered this encounter  Medications  . DISCONTD: metFORMIN (GLUCOPHAGE-XR) 500 MG 24 hr tablet    Sig: Take 1 tablet (500 mg total) by mouth daily with breakfast.    Dispense:  90 tablet    Refill:  2  . DISCONTD: lisinopril (ZESTRIL) 5 MG tablet    Sig: TAKE 1 TABLET BY MOUTH DAILY.    Dispense:  90 tablet    Refill:  2  . DISCONTD: atorvastatin (LIPITOR) 20 MG tablet    Sig: TAKE 1 TABLET BY MOUTH DAILY AT 6:00 PM    Dispense:  90 tablet    Refill:  2  . atorvastatin (LIPITOR) 20 MG tablet    Sig: TAKE 1 TABLET BY MOUTH DAILY AT 6:00 PM    Dispense:  90 tablet    Refill:  2  . lisinopril (ZESTRIL) 5 MG tablet    Sig: TAKE 1 TABLET BY MOUTH DAILY.    Dispense:  90 tablet    Refill:  2  . metFORMIN (GLUCOPHAGE-XR) 500 MG 24 hr tablet    Sig: Take 1 tablet (500 mg total) by mouth daily with breakfast.    Dispense:  90 tablet    Refill:  2    Return in about 6 months (around 06/02/2020) for T2DM.    This visit occurred during the SARS-CoV-2 public health emergency.  Safety protocols were in place, including screening questions prior to the visit, additional usage of staff PPE, and extensive cleaning of exam room while observing  appropriate contact time as indicated for disinfecting solutions.

## 2019-12-04 NOTE — Patient Instructions (Signed)
Great to see you today! Continue current medications.  Be sure to have labs completed.  See me again in about 6 months.

## 2019-12-07 DIAGNOSIS — E782 Mixed hyperlipidemia: Secondary | ICD-10-CM | POA: Diagnosis not present

## 2019-12-07 DIAGNOSIS — E1165 Type 2 diabetes mellitus with hyperglycemia: Secondary | ICD-10-CM | POA: Diagnosis not present

## 2019-12-08 LAB — COMPLETE METABOLIC PANEL WITH GFR
AG Ratio: 1.8 (calc) (ref 1.0–2.5)
ALT: 27 U/L (ref 9–46)
AST: 19 U/L (ref 10–40)
Albumin: 4.4 g/dL (ref 3.6–5.1)
Alkaline phosphatase (APISO): 57 U/L (ref 36–130)
BUN: 19 mg/dL (ref 7–25)
CO2: 26 mmol/L (ref 20–32)
Calcium: 9.3 mg/dL (ref 8.6–10.3)
Chloride: 105 mmol/L (ref 98–110)
Creat: 1.13 mg/dL (ref 0.60–1.35)
GFR, Est African American: 90 mL/min/{1.73_m2} (ref >=60)
GFR, Est Non African American: 78 mL/min/{1.73_m2} (ref >=60)
Globulin: 2.5 g/dL (calc) (ref 1.9–3.7)
Glucose, Bld: 111 mg/dL — ABNORMAL HIGH (ref 65–99)
Potassium: 4.5 mmol/L (ref 3.5–5.3)
Sodium: 141 mmol/L (ref 135–146)
Total Bilirubin: 0.4 mg/dL (ref 0.2–1.2)
Total Protein: 6.9 g/dL (ref 6.1–8.1)

## 2019-12-08 LAB — CBC
HCT: 45 % (ref 38.5–50.0)
Hemoglobin: 15.3 g/dL (ref 13.2–17.1)
MCH: 30.6 pg (ref 27.0–33.0)
MCHC: 34 g/dL (ref 32.0–36.0)
MCV: 90 fL (ref 80.0–100.0)
MPV: 10.4 fL (ref 7.5–12.5)
Platelets: 188 10*3/uL (ref 140–400)
RBC: 5 10*6/uL (ref 4.20–5.80)
RDW: 12.3 % (ref 11.0–15.0)
WBC: 6.5 10*3/uL (ref 3.8–10.8)

## 2019-12-08 LAB — LIPID PANEL
Cholesterol: 170 mg/dL (ref <200)
HDL: 35 mg/dL — ABNORMAL LOW (ref >=40)
LDL Cholesterol (Calc): 94 mg/dL (calc)
Non-HDL Cholesterol (Calc): 135 mg/dL (calc) — ABNORMAL HIGH (ref <130)
Total CHOL/HDL Ratio: 4.9 (calc) (ref <5.0)
Triglycerides: 290 mg/dL — ABNORMAL HIGH (ref <150)

## 2019-12-08 LAB — HEMOGLOBIN A1C
Hgb A1c MFr Bld: 5.5 % of total Hgb (ref <5.7)
Mean Plasma Glucose: 111 (calc)
eAG (mmol/L): 6.2 (calc)

## 2020-05-10 LAB — HM DIABETES EYE EXAM

## 2020-05-16 ENCOUNTER — Encounter: Payer: Self-pay | Admitting: Family Medicine

## 2020-06-30 ENCOUNTER — Ambulatory Visit (INDEPENDENT_AMBULATORY_CARE_PROVIDER_SITE_OTHER): Payer: BC Managed Care – PPO | Admitting: Family Medicine

## 2020-06-30 ENCOUNTER — Encounter: Payer: Self-pay | Admitting: Family Medicine

## 2020-06-30 ENCOUNTER — Other Ambulatory Visit: Payer: Self-pay

## 2020-06-30 VITALS — BP 122/79 | HR 59 | Temp 97.8°F | Ht 70.0 in | Wt 226.9 lb

## 2020-06-30 DIAGNOSIS — E782 Mixed hyperlipidemia: Secondary | ICD-10-CM

## 2020-06-30 DIAGNOSIS — R809 Proteinuria, unspecified: Secondary | ICD-10-CM

## 2020-06-30 DIAGNOSIS — E785 Hyperlipidemia, unspecified: Secondary | ICD-10-CM

## 2020-06-30 DIAGNOSIS — E1169 Type 2 diabetes mellitus with other specified complication: Secondary | ICD-10-CM

## 2020-06-30 DIAGNOSIS — E1165 Type 2 diabetes mellitus with hyperglycemia: Secondary | ICD-10-CM

## 2020-06-30 DIAGNOSIS — E1129 Type 2 diabetes mellitus with other diabetic kidney complication: Secondary | ICD-10-CM | POA: Diagnosis not present

## 2020-06-30 LAB — POCT GLYCOSYLATED HEMOGLOBIN (HGB A1C): Hemoglobin A1C: 5.3 % (ref 4.0–5.6)

## 2020-06-30 MED ORDER — ATORVASTATIN CALCIUM 20 MG PO TABS: ORAL_TABLET | ORAL | 2 refills | Status: DC

## 2020-06-30 MED ORDER — LISINOPRIL 5 MG PO TABS: ORAL_TABLET | ORAL | 2 refills | Status: DC

## 2020-06-30 MED ORDER — METFORMIN HCL ER 500 MG PO TB24: 500.0000 mg | ORAL_TABLET | Freq: Every day | ORAL | 2 refills | Status: AC

## 2020-06-30 NOTE — Assessment & Plan Note (Signed)
Most recent A1c of  Lab Results  Component Value Date   HGBA1C 5.3 06/30/2020   indicates diabetes is well controlled.  he  will continue at current dose of metformin for now.  Counseled on healthy, low carb diet and recommend frequent activity to help with maintaining good control of blood sugars.  He'll need a annual labs once he gets established in [place].

## 2020-06-30 NOTE — Progress Notes (Signed)
Douglas Wilkinson - 45 y.o. male MRN [id number]  Date of birth: 02-22-1976  Subjective Chief Complaint  Patient presents with  . Diabetes    HPI Douglas Wilkinson is a 45 y.o. male here today for follow up of diabetes and HLD.    He reports that overall he is doing well.  Compliant with current medications.  Admits diet could be improved some.  He has been experiencing some stress as he is moving to [place] in a couple of weeks. He denies symptoms related to his diabetes. He does still exercise some.  No issues with tolerance to atorvastatin for associated HLD.    ROS:  A comprehensive ROS was completed and negative except as noted per HPI  No Known Allergies  Past Medical History:  Diagnosis Date  . Diabetes mellitus without complication (HCC)   . Microalbuminuria due to type 2 diabetes mellitus (HCC)   . OSA on CPAP     Past Surgical History:  Procedure Laterality Date  . KNEE ARTHROSCOPY Right   . REFRACTIVE SURGERY    . VASECTOMY      Social History   Socioeconomic History  . Marital status: Married    Spouse name: Not on file  . Number of children: Not on file  . Years of education: Not on file  . Highest education level: Not on file  Occupational History  . Not on file  Tobacco Use  . Smoking status: Never Smoker  . Smokeless tobacco: Never Used  Vaping Use  . Vaping Use: Never used  Substance and Sexual Activity  . Alcohol use: Not Currently  . Drug use: Never  . Sexual activity: Yes    Birth control/protection: None  Other Topics Concern  . Not on file  Social History Narrative  . Not on file   Social Determinants of Health   Financial Resource Strain: Not on file  Food Insecurity: Not on file  Transportation Needs: Not on file  Physical Activity: Not on file  Stress: Not on file  Social Connections: Not on file    Family History  Problem Relation Age of Onset  . Heart attack Father   . Skin cancer Maternal Grandfather     Health Maintenance   Topic Date Due  . Hepatitis C Screening  Never done  . HIV Screening  Never done  . PNEUMOCOCCAL POLYSACCHARIDE VACCINE AGE 21-64 HIGH RISK  12/03/2020 (Originally 07/25/1977)  . INFLUENZA VACCINE  12/04/2020 (Originally 11/04/2019)  . COVID-19 Vaccine (3 - Booster for Pfizer series) 12/04/2020 (Originally 03/12/2020)  . COLONOSCOPY (Pts 45-51yrs Insurance coverage will need to be confirmed)  06/30/2021 (Originally 07/26/2019)  . HEMOGLOBIN A1C  12/31/2020  . OPHTHALMOLOGY EXAM  05/10/2021  . FOOT EXAM  06/30/2021  . TETANUS/TDAP  11/23/2028  . HPV VACCINES  Aged Out     ----------------------------------------------------------------------------------------------------------------------------------------------------------------------------------------------------------------- Physical Exam BP 122/79 (BP Location: Left Arm, Patient Position: Sitting, Cuff Size: Large)   Pulse (!) 59   Temp 97.8 F (36.6 C)   Ht 5\' 10"  (1.778 m)   Wt 226 lb 14.4 oz (102.9 kg)   SpO2 98%   BMI 32.56 kg/m   Physical Exam Constitutional:      Appearance: Normal appearance.  HENT:     Head: Normocephalic and atraumatic.  Eyes:     General: No scleral icterus. Cardiovascular:     Rate and Rhythm: Normal rate and regular rhythm.  Pulmonary:     Effort: Pulmonary effort is normal.  Breath sounds: Normal breath sounds.  Musculoskeletal:     Cervical back: Neck supple.  Skin:    General: Skin is warm and dry.  Neurological:     General: No focal deficit present.     Mental Status: He is alert.  Psychiatric:        Behavior: Behavior normal.     ------------------------------------------------------------------------------------------------------------------------------------------------------------------------------------------------------------------- Assessment and Plan  Type 2 diabetes mellitus with hyperglycemia, without long-term current use of insulin (HCC) Most recent A1c of  Lab  Results  Component Value Date   HGBA1C 5.3 06/30/2020   indicates diabetes is well controlled.  he  will continue at current dose of metformin for now.  Counseled on healthy, low carb diet and recommend frequent activity to help with maintaining good control of blood sugars.  He'll need a annual labs once he gets established in [place].    Mixed hypercholesterolemia and hypertriglyceridemia Tolerating atorvastatin well, recommend continuation.     Meds ordered this encounter  Medications  . atorvastatin (LIPITOR) 20 MG tablet    Sig: TAKE 1 TABLET BY MOUTH DAILY AT 6:00 PM    Dispense:  90 tablet    Refill:  2  . lisinopril (ZESTRIL) 5 MG tablet    Sig: TAKE 1 TABLET BY MOUTH DAILY.    Dispense:  90 tablet    Refill:  2  . metFORMIN (GLUCOPHAGE-XR) 500 MG 24 hr tablet    Sig: Take 1 tablet (500 mg total) by mouth daily with breakfast.    Dispense:  90 tablet    Refill:  2    No follow-ups on file.    This visit occurred during the SARS-CoV-2 public health emergency.  Safety protocols were in place, including screening questions prior to the visit, additional usage of staff PPE, and extensive cleaning of exam room while observing appropriate contact time as indicated for disinfecting solutions.

## 2020-06-30 NOTE — Assessment & Plan Note (Signed)
Tolerating atorvastatin well, recommend continuation.  

## 2020-06-30 NOTE — Patient Instructions (Addendum)
Great to see you! Best wishes on the move to [place] Please continue current medications.

## 2020-09-05 IMAGING — CT CT RENAL STONE PROTOCOL
2 of 4 series · 16 of 46 positions shown, 18 images · non-contrast
Comparison: None.

CLINICAL DATA: Right abdomen/flank pain

EXAM:
CT ABDOMEN AND PELVIS WITHOUT CONTRAST
TECHNIQUE: Multidetector CT imaging of the abdomen and pelvis was performed
following the standard protocol without oral or IV contrast.

[Series 3: renal stone 5.0 · axial · 0.78mm/px · z∈[-535,-90]mm · 13 of 99 slices shown, 15 images]
[im 5/99  soft-tissue]
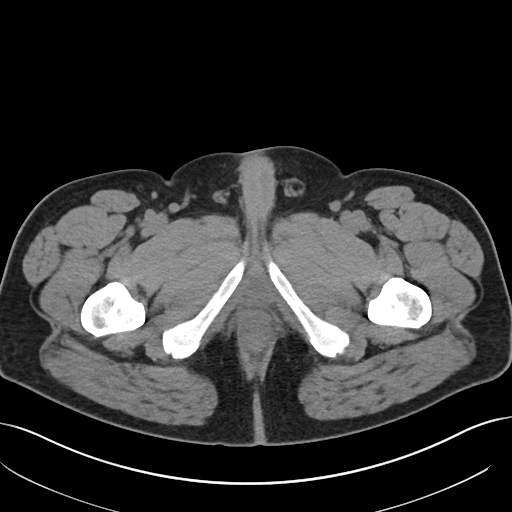
[im 5/99  bone]
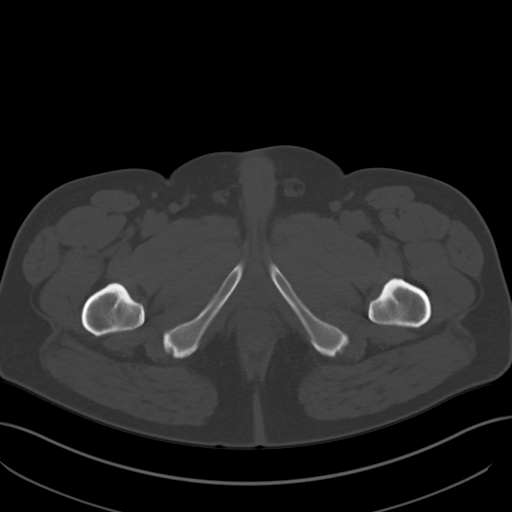
[im 13/99  soft-tissue]
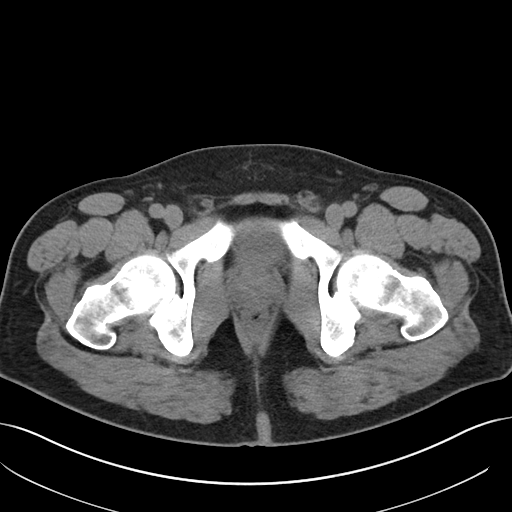
[im 21/99  soft-tissue]
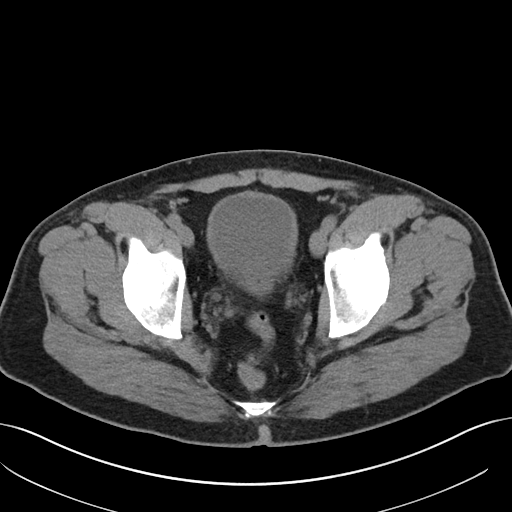
[im 29/99  soft-tissue]
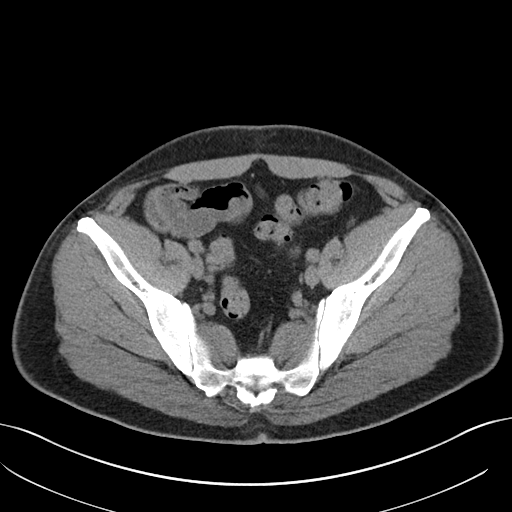
[im 33/99  soft-tissue]
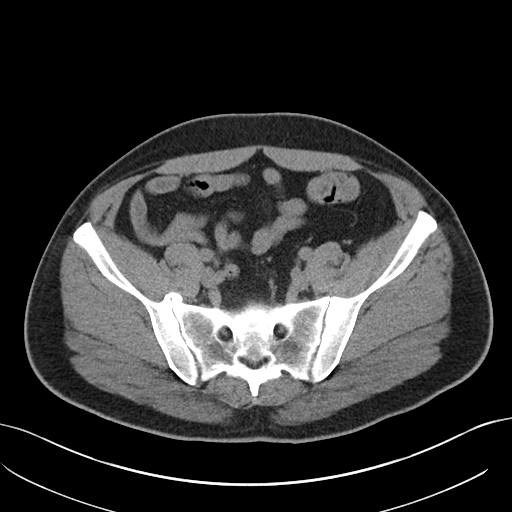
[im 41/99  soft-tissue]
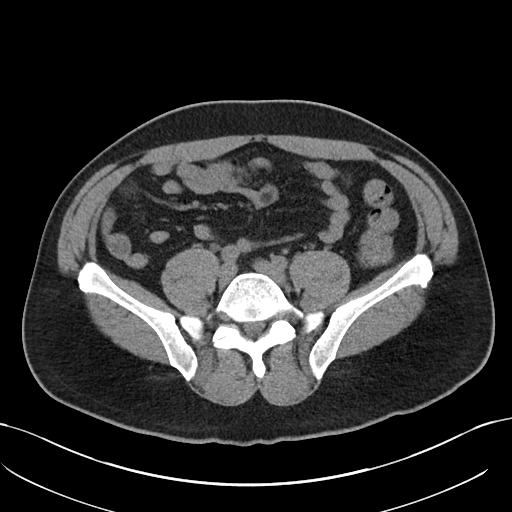
[im 50/99  soft-tissue]
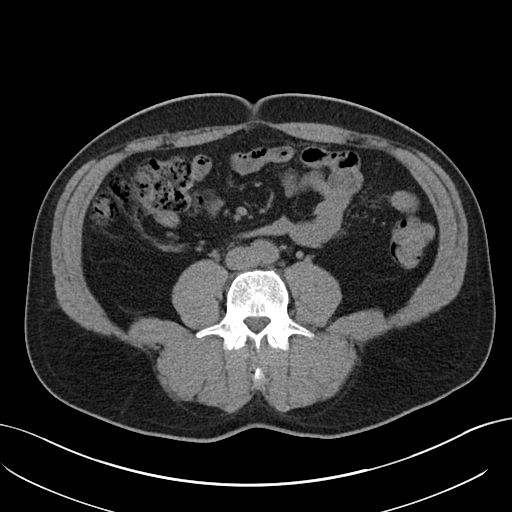
[im 58/99  soft-tissue]
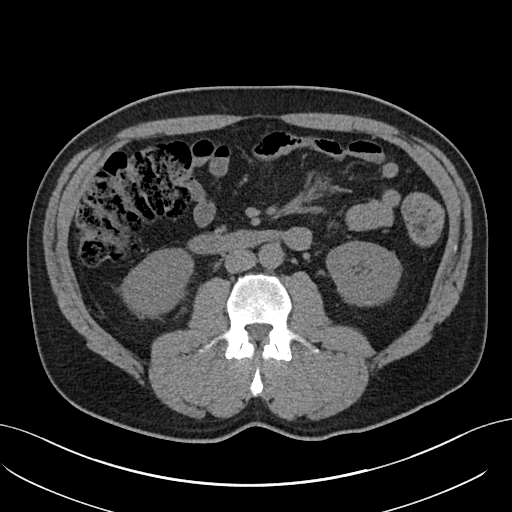
[im 66/99  soft-tissue]
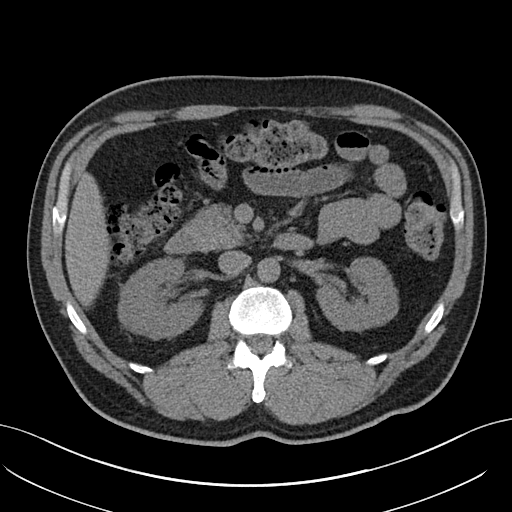
[im 66/99  bone]
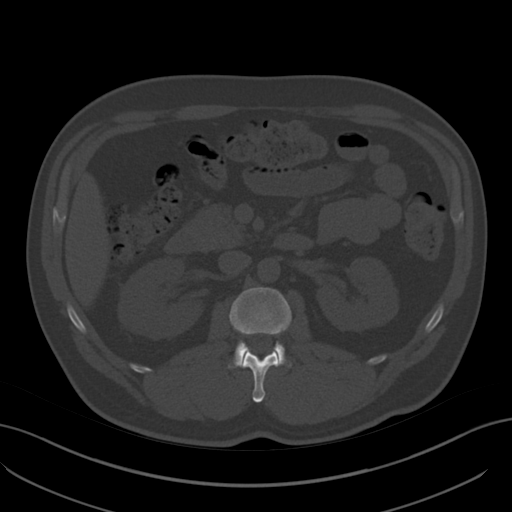
[im 70/99  soft-tissue]
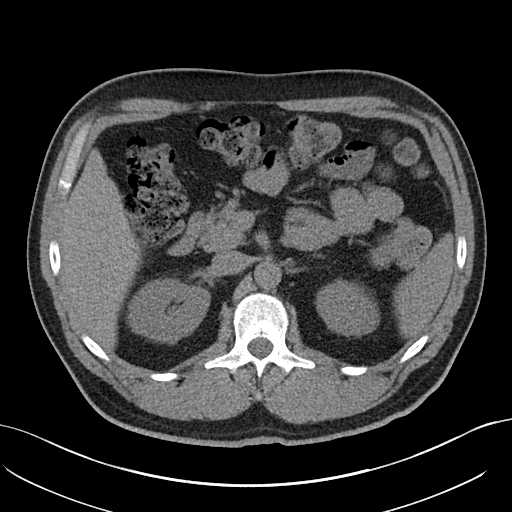
[im 78/99  soft-tissue]
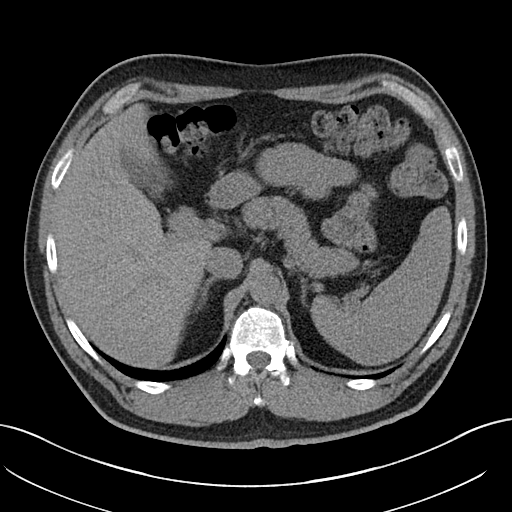
[im 86/99  soft-tissue]
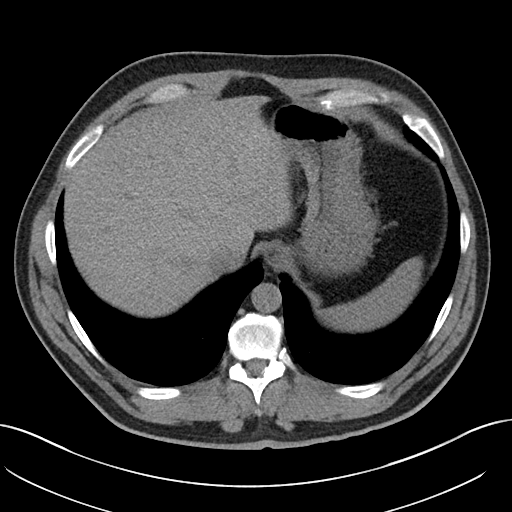
[im 94/99  soft-tissue]
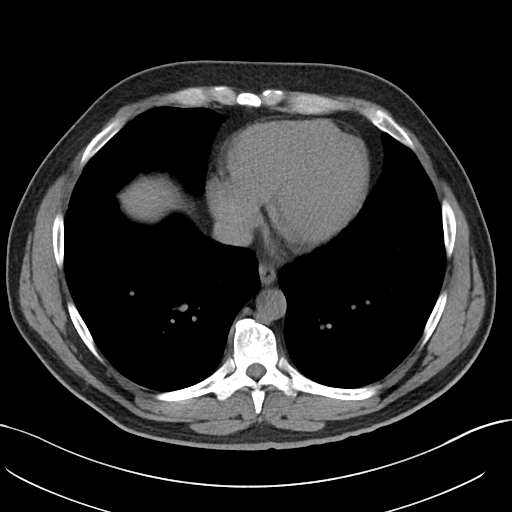

[Series 5: renal stone 3.0 cor · coronal · 0.77mm/px · 3 of 100 slices shown]
[im 34/100  soft-tissue]
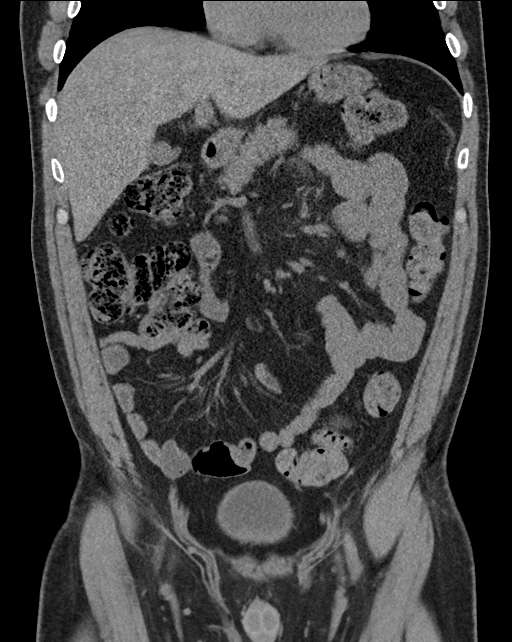
[im 45/100  soft-tissue]
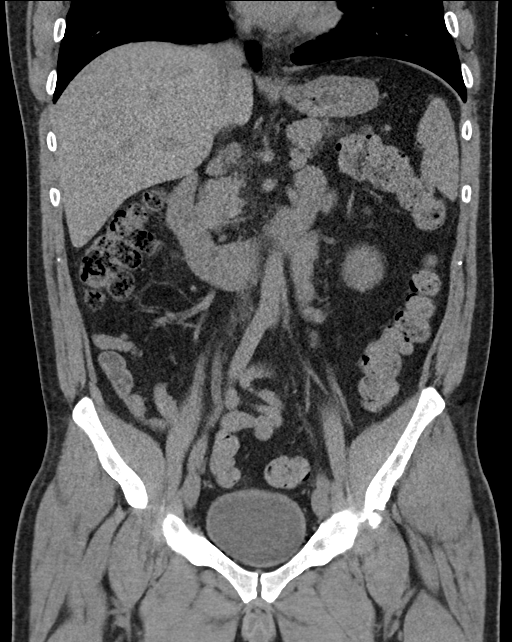
[im 56/100  soft-tissue]
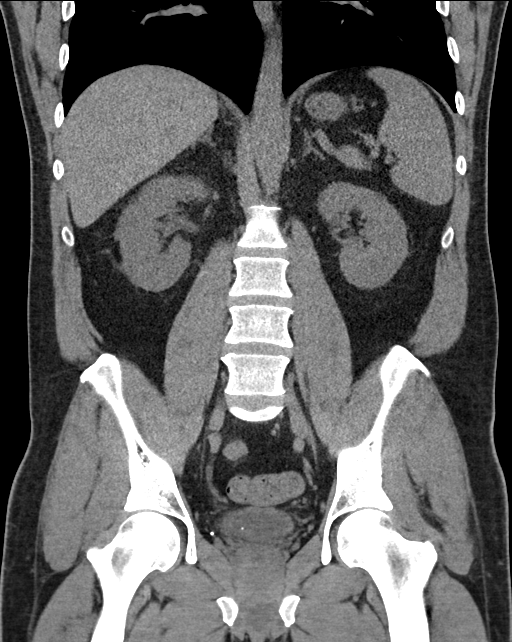

[16 of 46 positions shown; findings below may reference images not displayed]

FINDINGS: Lower chest: Lung bases are clear.

Hepatobiliary: No focal liver lesions are evident on this
noncontrast enhanced study. The gallbladder wall is not appreciably
thickened. There is no biliary duct dilatation.

Pancreas: There is no pancreatic mass or inflammatory focus.

Spleen: No splenic lesions are evident.

Adrenals/Urinary Tract: Adrenals bilaterally appear normal. There is
no demonstrable renal mass on either side. There is slight
hydronephrosis on the right. There is no hydronephrosis on the left.
There is no intrarenal calculus on either side. There is a 2 x 1 mm
calculus at the right ureterovesical junction. No other ureteral
calculi are evident. Urinary bladder is midline with wall thickness
within normal limits.

Stomach/Bowel: There is moderate stool in the colon. There is no
appreciable bowel wall or mesenteric thickening. The terminal ileum
appears unremarkable. There is no evident free air or portal venous
air.

Vascular/Lymphatic: There is no abdominal aortic aneurysm. No
vascular lesions are evident on this noncontrast enhanced study.
There is no evident adenopathy in the abdomen or pelvis.

Reproductive: Prostate and seminal vesicles are normal in size and
contour. There is no evident pelvic mass.

Other: Appendix appears normal. No abscess or ascites is evident in
the abdomen or pelvis. There is slight fat in the umbilicus.

Musculoskeletal: No blastic or lytic bone lesions. No intramuscular
lesions evident.
IMPRESSION: 1. 2 x 1 mm calculus at the right ureterovesical junction with mild
hydronephrosis on the right.

2. No bowel obstruction. No abscess in the abdomen or pelvis.
Appendix appears normal.

## 2020-09-05 IMAGING — US US SCROTUM W/ DOPPLER COMPLETE
2 series · 14 of 25 positions shown · non-contrast
Comparison: None.

CLINICAL DATA: Right testicular pain for 3 days

EXAM:
SCROTAL ULTRASOUND
DOPPLER ULTRASOUND OF THE TESTICLES
TECHNIQUE: Complete ultrasound examination of the testicles, epididymis, and
other scrotal structures was performed. Color and spectral Doppler
ultrasound were also utilized to evaluate blood flow to the
testicles.

[Series 1: us scrotum w/ doppler complete · 13 of 47 slices shown (1 of 2)]
[im 1/47]
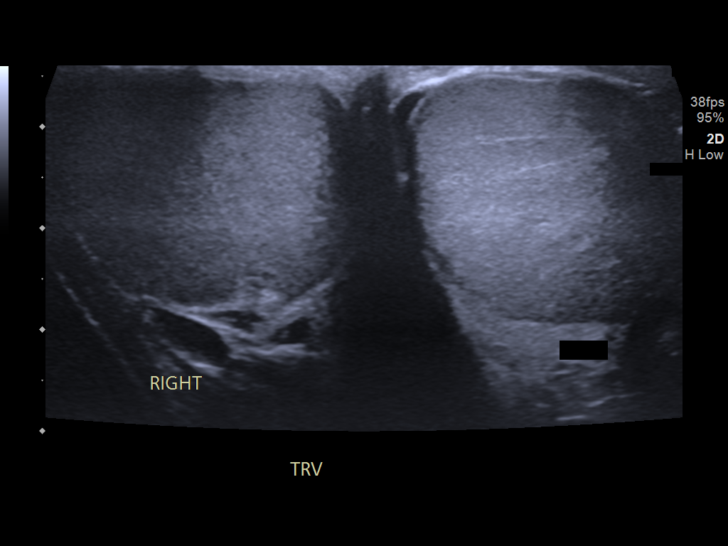
[im 5/47]
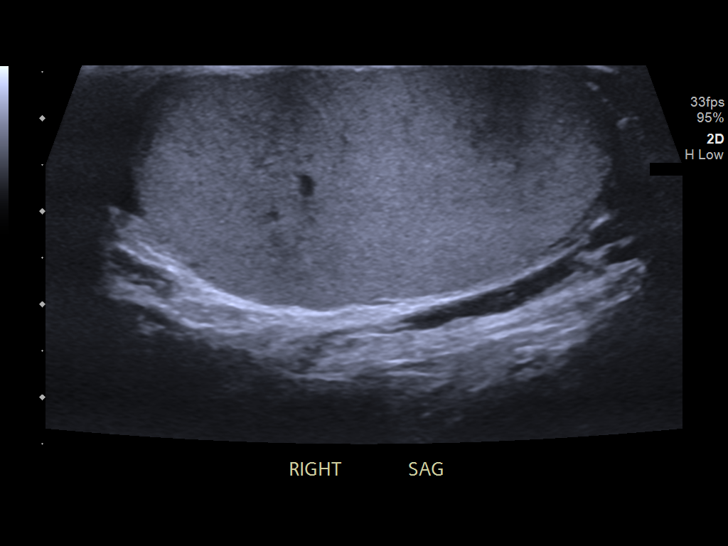
[im 9/47]
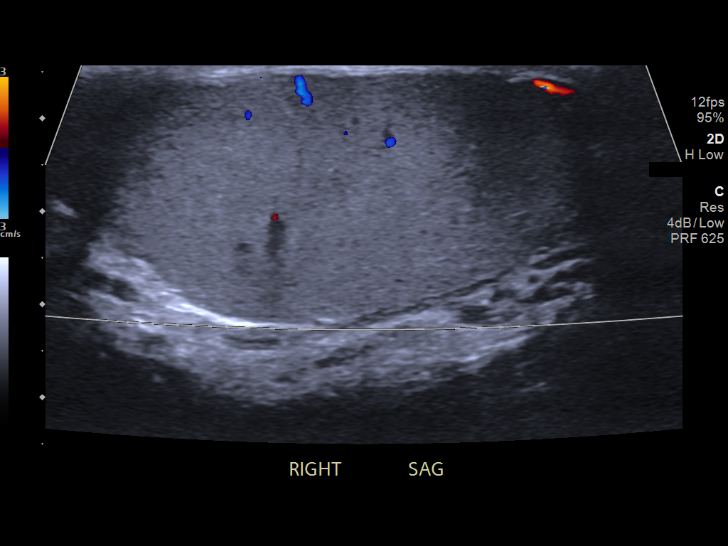
[im 13/47]
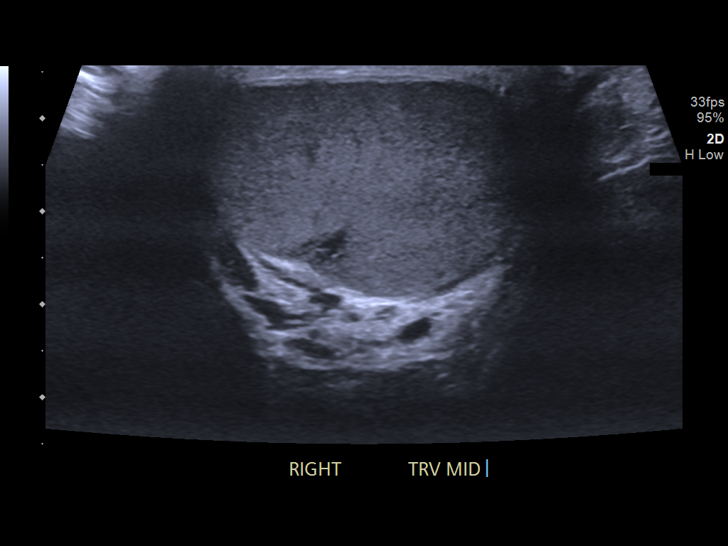
[im 17/47]
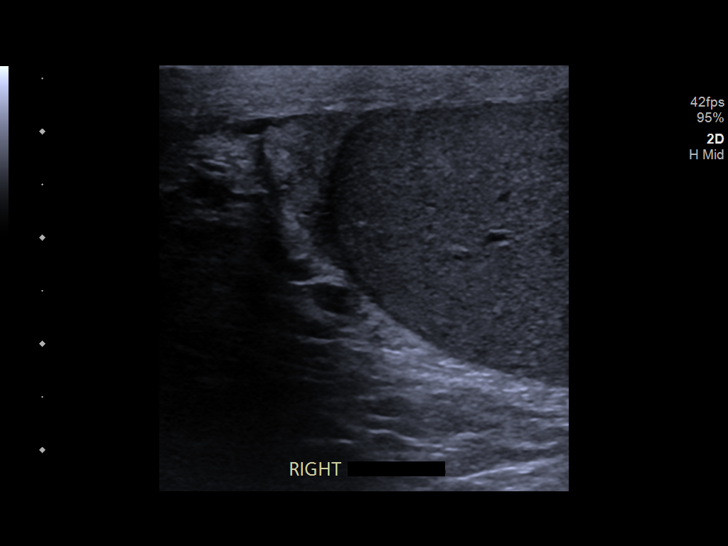
[im 19/47]
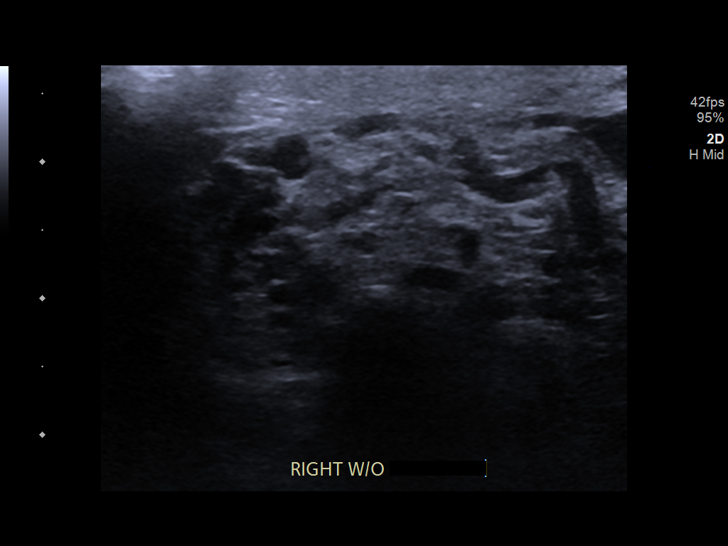
[im 23/47]
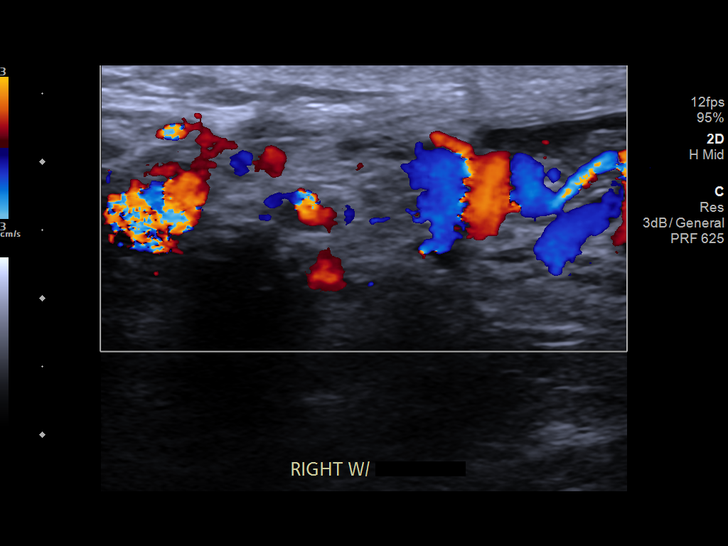
[im 27/47]
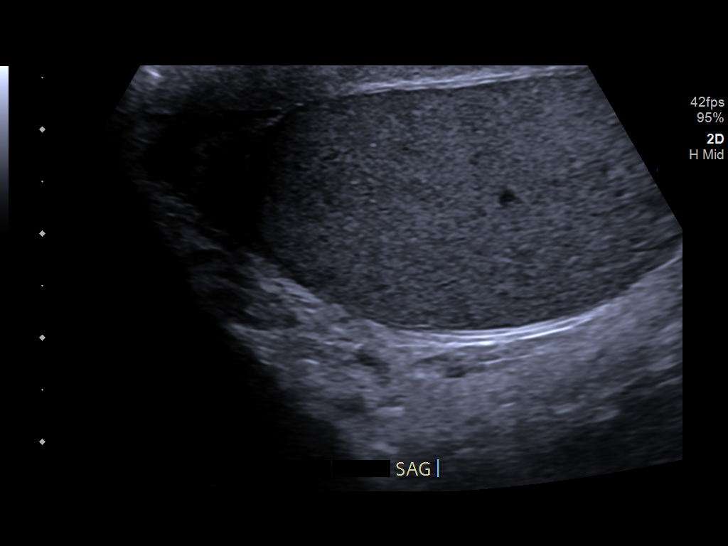
[im 31/47]
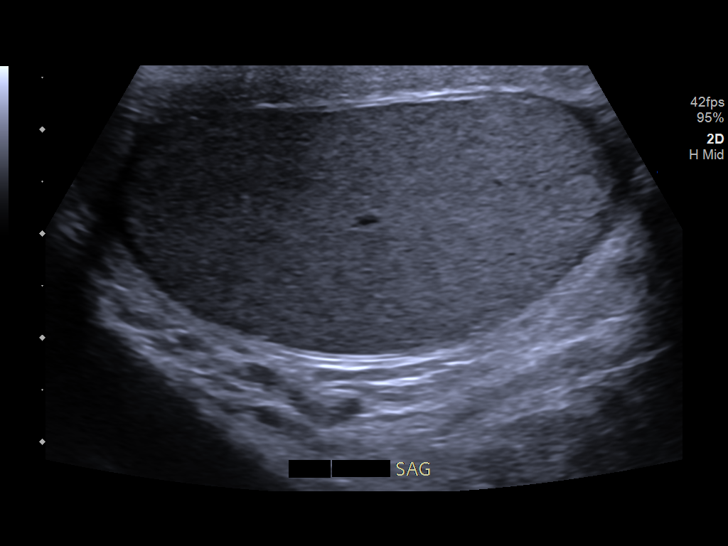
[im 33/47]
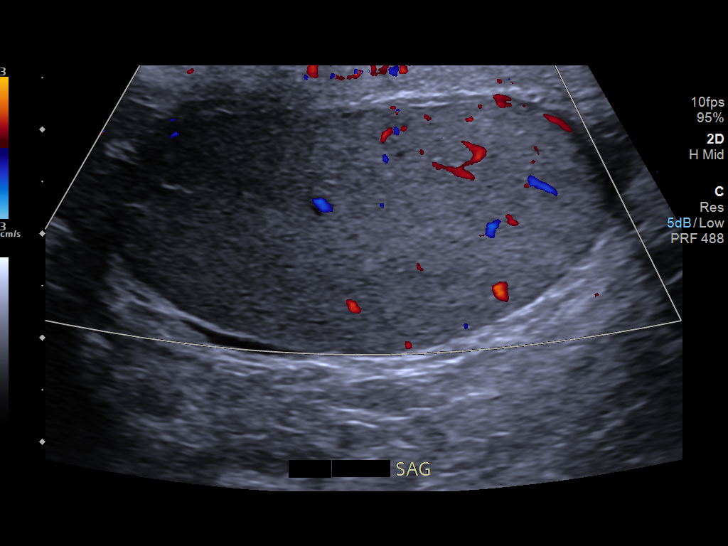
[im 37/47]
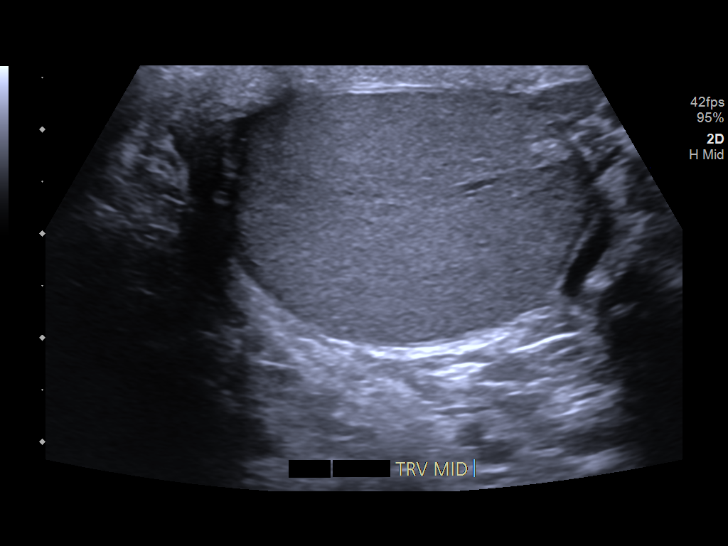
[im 41/47]
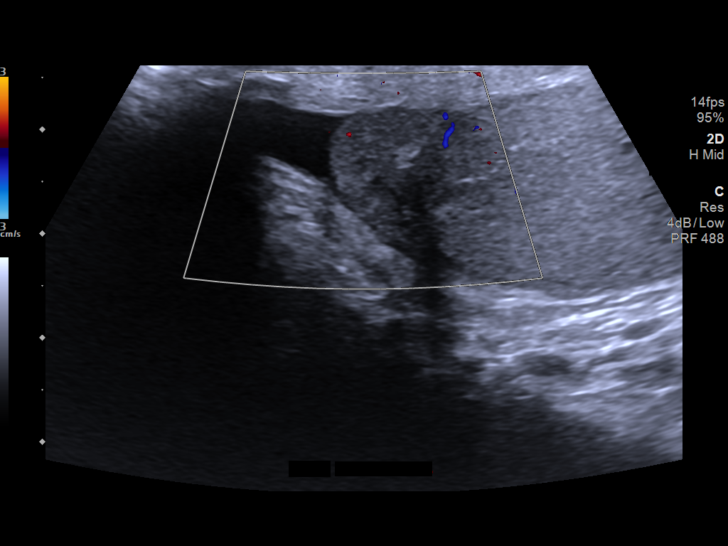
[im 45/47]
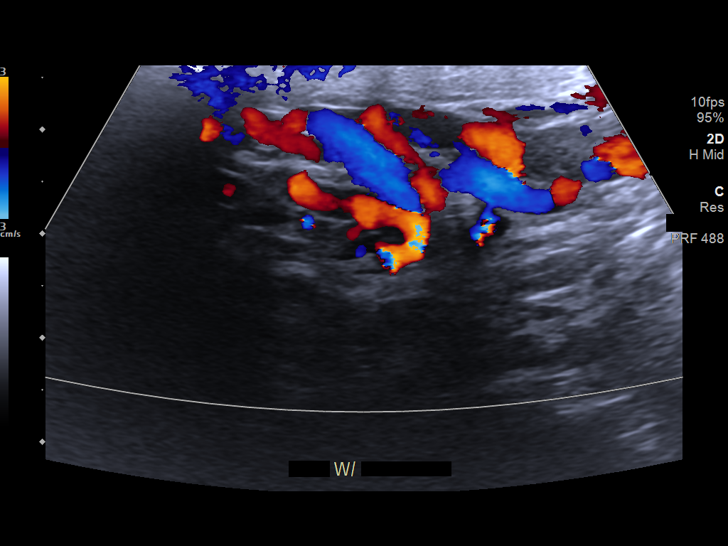

[Series 2: us scrotum w/ doppler complete · 1 of 2 slices shown (2 of 2)]
[im 1/2]
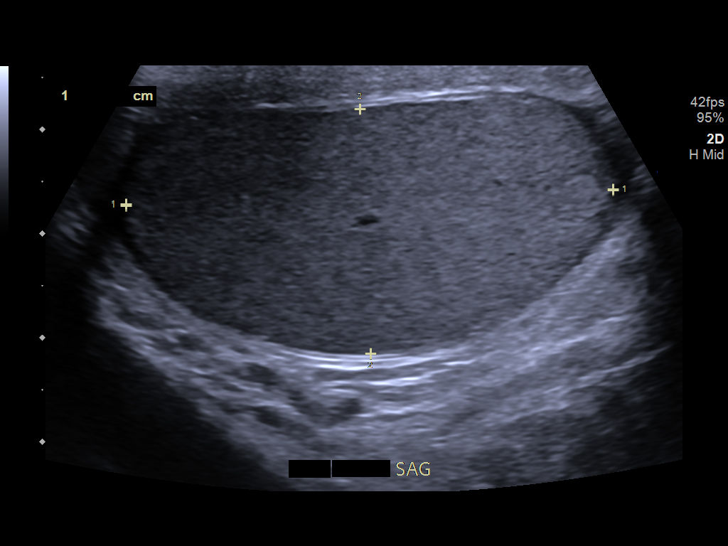

[14 of 25 positions shown; findings below may reference images not displayed]

FINDINGS: Right testicle

Measurements: 52 x 25 x 34 mm.  No mass or abnormal vascularity

Left testicle

Measurements:  47 x 24 x 35 mm.  No mass or abnormal vascularity.

Right epididymis:  Normal in size and appearance.

Left epididymis:  Normal in size and appearance.

Hydrocele:  None visualized.

Varicocele: None visualized. No veins dilate to 3 mm or greater
during Valsalva.

Pulsed Doppler interrogation of both testes demonstrates normal low
resistance arterial and venous waveforms bilaterally.
IMPRESSION: Negative testicular ultrasound.

## 2020-09-10 ENCOUNTER — Other Ambulatory Visit: Payer: Self-pay

## 2020-09-10 DIAGNOSIS — E782 Mixed hyperlipidemia: Secondary | ICD-10-CM

## 2020-09-10 DIAGNOSIS — R809 Proteinuria, unspecified: Secondary | ICD-10-CM

## 2020-09-10 DIAGNOSIS — E1169 Type 2 diabetes mellitus with other specified complication: Secondary | ICD-10-CM

## 2020-09-10 MED ORDER — LISINOPRIL 5 MG PO TABS: ORAL_TABLET | ORAL | 2 refills | Status: AC

## 2020-09-10 MED ORDER — ATORVASTATIN CALCIUM 20 MG PO TABS: ORAL_TABLET | ORAL | 2 refills | Status: AC
